# Patient Record
Sex: Female | Born: 1955
Health system: Southern US, Community
[De-identification: ages and names within clinical notes are randomized; demographics above are authoritative.]

## PROBLEM LIST (undated history)

## (undated) DIAGNOSIS — F5105 Insomnia due to other mental disorder: Secondary | ICD-10-CM

## (undated) DIAGNOSIS — Z0000 Encounter for general adult medical examination without abnormal findings: Secondary | ICD-10-CM

## (undated) DIAGNOSIS — B1920 Unspecified viral hepatitis C without hepatic coma: Secondary | ICD-10-CM

## (undated) DIAGNOSIS — E785 Hyperlipidemia, unspecified: Secondary | ICD-10-CM

## (undated) DIAGNOSIS — Z8619 Personal history of other infectious and parasitic diseases: Principal | ICD-10-CM

## (undated) DIAGNOSIS — E559 Vitamin D deficiency, unspecified: Secondary | ICD-10-CM

## (undated) HISTORY — DX: Unspecified viral hepatitis C without hepatic coma: B19.20

## (undated) HISTORY — DX: Encounter for general adult medical examination without abnormal findings: Z00.00

## (undated) HISTORY — PX: HAND SURGERY: SHX662

## (undated) HISTORY — DX: Vitamin D deficiency, unspecified: E55.9

## (undated) HISTORY — DX: Hyperlipidemia, unspecified: E78.5

## (undated) HISTORY — DX: Insomnia due to other mental disorder: F51.05

## (undated) HISTORY — DX: Personal history of other infectious and parasitic diseases: Z86.19

---

## 2011-09-22 LAB — HM MAMMOGRAPHY

## 2012-02-10 LAB — HM COLONOSCOPY

## 2014-07-18 ENCOUNTER — Other Ambulatory Visit: Payer: Self-pay

## 2014-07-18 DIAGNOSIS — Z1231 Encounter for screening mammogram for malignant neoplasm of breast: Secondary | ICD-10-CM

## 2014-07-19 ENCOUNTER — Ambulatory Visit
Admission: RE | Admit: 2014-07-19 | Discharge: 2014-07-19 | Disposition: A | Discharge status: Home / Self Care | Payer: 59 | Source: Ambulatory Visit

## 2014-07-19 DIAGNOSIS — Z1231 Encounter for screening mammogram for malignant neoplasm of breast: Secondary | ICD-10-CM

## 2014-11-20 ENCOUNTER — Other Ambulatory Visit: Payer: Self-pay | Admitting: Family Medicine

## 2014-11-20 DIAGNOSIS — R609 Edema, unspecified: Secondary | ICD-10-CM

## 2015-03-24 ENCOUNTER — Institutional Professional Consult (permissible substitution): Payer: Self-pay | Admitting: Family Medicine

## 2015-04-15 LAB — HM PAP SMEAR: HM PAP: NORMAL

## 2015-05-30 ENCOUNTER — Other Ambulatory Visit: Payer: Self-pay | Admitting: Family Medicine

## 2015-05-30 DIAGNOSIS — K7460 Unspecified cirrhosis of liver: Secondary | ICD-10-CM

## 2015-06-10 ENCOUNTER — Ambulatory Visit
Admission: RE | Admit: 2015-06-10 | Discharge: 2015-06-10 | Disposition: A | Discharge status: Home / Self Care | Payer: 59 | Source: Ambulatory Visit | Attending: Family Medicine | Admitting: Family Medicine

## 2015-06-10 DIAGNOSIS — K7460 Unspecified cirrhosis of liver: Secondary | ICD-10-CM

## 2015-07-08 MED FILL — ZOLPIDEM TARTRATE 10 MG TAB: 10 | 30 days supply | Qty: 30 | Fill #1

## 2015-07-14 DIAGNOSIS — M62838 Other muscle spasm: Secondary | ICD-10-CM | POA: Diagnosis not present

## 2015-07-17 DIAGNOSIS — M549 Dorsalgia, unspecified: Secondary | ICD-10-CM | POA: Diagnosis not present

## 2015-07-21 DIAGNOSIS — S39012D Strain of muscle, fascia and tendon of lower back, subsequent encounter: Secondary | ICD-10-CM | POA: Diagnosis not present

## 2015-08-07 ENCOUNTER — Telehealth: Payer: Self-pay | Admitting: *Deleted

## 2015-08-07 NOTE — Telephone Encounter (Signed)
Unable to reach patient at time of pre-visit call. Left message for patient to return call when available.  

## 2015-08-08 ENCOUNTER — Ambulatory Visit (INDEPENDENT_AMBULATORY_CARE_PROVIDER_SITE_OTHER): Payer: 59 | Admitting: Family Medicine

## 2015-08-08 ENCOUNTER — Encounter: Payer: Self-pay | Admitting: Family Medicine

## 2015-08-08 VITALS — BP 138/80 | HR 77 | Temp 98.4°F | Ht 65.0 in | Wt 139.5 lb

## 2015-08-08 DIAGNOSIS — Z8619 Personal history of other infectious and parasitic diseases: Secondary | ICD-10-CM | POA: Insufficient documentation

## 2015-08-08 DIAGNOSIS — Z0000 Encounter for general adult medical examination without abnormal findings: Secondary | ICD-10-CM

## 2015-08-08 DIAGNOSIS — B1920 Unspecified viral hepatitis C without hepatic coma: Secondary | ICD-10-CM

## 2015-08-08 HISTORY — DX: Personal history of other infectious and parasitic diseases: Z86.19

## 2015-08-08 NOTE — Progress Notes (Signed)
Pre visit review using our clinic review tool, if applicable. No additional management support is needed unless otherwise documented below in the visit note. 

## 2015-08-08 NOTE — Patient Instructions (Addendum)
NOW probiotics, 10 strain daily [email or web address]   Preventive Care for Adults, Female A healthy lifestyle and preventive care can promote health and wellness. Preventive health guidelines for women include the following key practices.  A routine yearly physical is a good way to check with your health care provider about your health and preventive screening. It is a chance to share any concerns and updates on your health and to receive a thorough exam.  Visit your dentist for a routine exam and preventive care every 6 months. Brush your teeth twice a day and floss once a day. Good oral hygiene prevents tooth decay and gum disease.  The frequency of eye exams is based on your age, health, family medical history, use of contact lenses, and other factors. Follow your health care provider's recommendations for frequency of eye exams.  Eat a healthy diet. Foods like vegetables, fruits, whole grains, low-fat dairy products, and lean protein foods contain the nutrients you need without too many calories. Decrease your intake of foods high in solid fats, added sugars, and salt. Eat the right amount of calories for you.Get information about a proper diet from your health care provider, if necessary.  Regular physical exercise is one of the most important things you can do for your health. Most adults should get at least 150 minutes of moderate-intensity exercise (any activity that increases your heart rate and causes you to sweat) each week. In addition, most adults need muscle-strengthening exercises on 2 or more days a week.  Maintain a healthy weight. The body mass index (BMI) is a screening tool to identify possible weight problems. It provides an estimate of body fat based on height and weight. Your health care provider can find your BMI and can help you achieve or maintain a healthy weight.For adults 20 years and older:  A BMI below 18.5 is considered underweight.  A BMI of 18.5 to 24.9 is  normal.  A BMI of 25 to 29.9 is considered overweight.  A BMI of 30 and above is considered obese.  Maintain normal blood lipids and cholesterol levels by exercising and minimizing your intake of saturated fat. Eat a balanced diet with plenty of fruit and vegetables. Blood tests for lipids and cholesterol should begin at age 98 and be repeated every 5 years. If your lipid or cholesterol levels are high, you are over 50, or you are at high risk for heart disease, you may need your cholesterol levels checked more frequently.Ongoing high lipid and cholesterol levels should be treated with medicines if diet and exercise are not working.  If you smoke, find out from your health care provider how to quit. If you do not use tobacco, do not start.  Lung cancer screening is recommended for adults aged 57-80 years who are at high risk for developing lung cancer because of a history of smoking. A yearly low-dose CT scan of the lungs is recommended for people who have at least a 30-pack-year history of smoking and are a current smoker or have quit within the past 15 years. A pack year of smoking is smoking an average of 1 pack of cigarettes a day for 1 year (for example: 1 pack a day for 30 years or 2 packs a day for 15 years). Yearly screening should continue until the smoker has stopped smoking for at least 15 years. Yearly screening should be stopped for people who develop a health problem that would prevent them from having lung cancer treatment.  If you are  pregnant, do not drink alcohol. If you are breastfeeding, be very cautious about drinking alcohol. If you are not pregnant and choose to drink alcohol, do not have more than 1 drink per day. One drink is considered to be 12 ounces (355 mL) of beer, 5 ounces (148 mL) of wine, or 1.5 ounces (44 mL) of liquor.  Avoid use of street drugs. Do not share needles with anyone. Ask for help if you need support or instructions about stopping the use of  drugs.  High blood pressure causes heart disease and increases the risk of stroke. Your blood pressure should be checked at least every 1 to 2 years. Ongoing high blood pressure should be treated with medicines if weight loss and exercise do not work.  If you are 88-84 years old, ask your health care provider if you should take aspirin to prevent strokes.  Diabetes screening is done by taking a blood sample to check your blood glucose level after you have not eaten for a certain period of time (fasting). If you are not overweight and you do not have risk factors for diabetes, you should be screened once every 3 years starting at age 34. If you are overweight or obese and you are 62-54 years of age, you should be screened for diabetes every year as part of your cardiovascular risk assessment.  Breast cancer screening is essential preventive care for women. You should practice "breast self-awareness." This means understanding the normal appearance and feel of your breasts and may include breast self-examination. Any changes detected, no matter how small, should be reported to a health care provider. Women in their 44s and 30s should have a clinical breast exam (CBE) by a health care provider as part of a regular health exam every 1 to 3 years. After age 63, women should have a CBE every year. Starting at age 73, women should consider having a mammogram (breast X-ray test) every year. Women who have a family history of breast cancer should talk to their health care provider about genetic screening. Women at a high risk of breast cancer should talk to their health care providers about having an MRI and a mammogram every year.  Breast cancer gene (BRCA)-related cancer risk assessment is recommended for women who have family members with BRCA-related cancers. BRCA-related cancers include breast, ovarian, tubal, and peritoneal cancers. Having family members with these cancers may be associated with an increased  risk for harmful changes (mutations) in the breast cancer genes BRCA1 and BRCA2. Results of the assessment will determine the need for genetic counseling and BRCA1 and BRCA2 testing.  Your health care provider may recommend that you be screened regularly for cancer of the pelvic organs (ovaries, uterus, and vagina). This screening involves a pelvic examination, including checking for microscopic changes to the surface of your cervix (Pap test). You may be encouraged to have this screening done every 3 years, beginning at age 74.  For women ages 70-65, health care providers may recommend pelvic exams and Pap testing every 3 years, or they may recommend the Pap and pelvic exam, combined with testing for human papilloma virus (HPV), every 5 years. Some types of HPV increase your risk of cervical cancer. Testing for HPV may also be done on women of any age with unclear Pap test results.  Other health care providers may not recommend any screening for nonpregnant women who are considered low risk for pelvic cancer and who do not have symptoms. Ask your health care provider  if a screening pelvic exam is right for you.  If you have had past treatment for cervical cancer or a condition that could lead to cancer, you need Pap tests and screening for cancer for at least 20 years after your treatment. If Pap tests have been discontinued, your risk factors (such as having a new sexual partner) need to be reassessed to determine if screening should resume. Some women have medical problems that increase the chance of getting cervical cancer. In these cases, your health care provider may recommend more frequent screening and Pap tests.  Colorectal cancer can be detected and often prevented. Most routine colorectal cancer screening begins at the age of 37 years and continues through age 14 years. However, your health care provider may recommend screening at an earlier age if you have risk factors for colon cancer. On a  yearly basis, your health care provider may provide home test kits to check for hidden blood in the stool. Use of a small camera at the end of a tube, to directly examine the colon (sigmoidoscopy or colonoscopy), can detect the earliest forms of colorectal cancer. Talk to your health care provider about this at age 23, when routine screening begins. Direct exam of the colon should be repeated every 5-10 years through age 66 years, unless early forms of precancerous polyps or small growths are found.  People who are at an increased risk for hepatitis B should be screened for this virus. You are considered at high risk for hepatitis B if:  You were born in a country where hepatitis B occurs often. Talk with your health care provider about which countries are considered high risk.  Your parents were born in a high-risk country and you have not received a shot to protect against hepatitis B (hepatitis B vaccine).  You have HIV or AIDS.  You use needles to inject street drugs.  You live with, or have sex with, someone who has hepatitis B.  You get hemodialysis treatment.  You take certain medicines for conditions like cancer, organ transplantation, and autoimmune conditions.  Hepatitis C blood testing is recommended for all people born from [birth year] through 1965 and any individual with known risks for hepatitis C.  Practice safe sex. Use condoms and avoid high-risk sexual practices to reduce the spread of sexually transmitted infections (STIs). STIs include gonorrhea, chlamydia, syphilis, trichomonas, herpes, HPV, and human immunodeficiency virus (HIV). Herpes, HIV, and HPV are viral illnesses that have no cure. They can result in disability, cancer, and death.  You should be screened for sexually transmitted illnesses (STIs) including gonorrhea and chlamydia if:  You are sexually active and are younger than 24 years.  You are older than 24 years and your health care provider tells you that you are  at risk for this type of infection.  Your sexual activity has changed since you were last screened and you are at an increased risk for chlamydia or gonorrhea. Ask your health care provider if you are at risk.  If you are at risk of being infected with HIV, it is recommended that you take a prescription medicine daily to prevent HIV infection. This is called preexposure prophylaxis (PrEP). You are considered at risk if:  You are sexually active and do not regularly use condoms or know the HIV status of your partner(s).  You take drugs by injection.  You are sexually active with a partner who has HIV.  Talk with your health care provider about whether you are at  high risk of being infected with HIV. If you choose to begin PrEP, you should first be tested for HIV. You should then be tested every 3 months for as long as you are taking PrEP.  Osteoporosis is a disease in which the bones lose minerals and strength with aging. This can result in serious bone fractures or breaks. The risk of osteoporosis can be identified using a bone density scan. Women ages 56 years and over and women at risk for fractures or osteoporosis should discuss screening with their health care providers. Ask your health care provider whether you should take a calcium supplement or vitamin D to reduce the rate of osteoporosis.  Menopause can be associated with physical symptoms and risks. Hormone replacement therapy is available to decrease symptoms and risks. You should talk to your health care provider about whether hormone replacement therapy is right for you.  Use sunscreen. Apply sunscreen liberally and repeatedly throughout the day. You should seek shade when your shadow is shorter than you. Protect yourself by wearing long sleeves, pants, a wide-brimmed hat, and sunglasses year round, whenever you are outdoors.  Once a month, do a whole body skin exam, using a mirror to look at the skin on your back. Tell your health  care provider of new moles, moles that have irregular borders, moles that are larger than a pencil eraser, or moles that have changed in shape or color.  Stay current with required vaccines (immunizations).  Influenza vaccine. All adults should be immunized every year.  Tetanus, diphtheria, and acellular pertussis (Td, Tdap) vaccine. Pregnant women should receive 1 dose of Tdap vaccine during each pregnancy. The dose should be obtained regardless of the length of time since the last dose. Immunization is preferred during the 27th-36th week of gestation. An adult who has not previously received Tdap or who does not know her vaccine status should receive 1 dose of Tdap. This initial dose should be followed by tetanus and diphtheria toxoids (Td) booster doses every 10 years. Adults with an unknown or incomplete history of completing a 3-dose immunization series with Td-containing vaccines should begin or complete a primary immunization series including a Tdap dose. Adults should receive a Td booster every 10 years.  Varicella vaccine. An adult without evidence of immunity to varicella should receive 2 doses or a second dose if she has previously received 1 dose. Pregnant females who do not have evidence of immunity should receive the first dose after pregnancy. This first dose should be obtained before leaving the health care facility. The second dose should be obtained 4-8 weeks after the first dose.  Human papillomavirus (HPV) vaccine. Females aged 13-26 years who have not received the vaccine previously should obtain the 3-dose series. The vaccine is not recommended for use in pregnant females. However, pregnancy testing is not needed before receiving a dose. If a female is found to be pregnant after receiving a dose, no treatment is needed. In that case, the remaining doses should be delayed until after the pregnancy. Immunization is recommended for any person with an immunocompromised condition through  the age of 42 years if she did not get any or all doses earlier. During the 3-dose series, the second dose should be obtained 4-8 weeks after the first dose. The third dose should be obtained 24 weeks after the first dose and 16 weeks after the second dose.  Zoster vaccine. One dose is recommended for adults aged 105 years or older unless certain conditions are present.  Measles, mumps, and rubella (MMR) vaccine. Adults born before [birth year] generally are considered immune to measles and mumps. Adults born in [birth year] or later should have 1 or more doses of MMR vaccine unless there is a contraindication to the vaccine or there is laboratory evidence of immunity to each of the three diseases. A routine second dose of MMR vaccine should be obtained at least 28 days after the first dose for students attending postsecondary schools, health care workers, or international travelers. People who received inactivated measles vaccine or an unknown type of measles vaccine during 1963-1967 should receive 2 doses of MMR vaccine. People who received inactivated mumps vaccine or an unknown type of mumps vaccine before 1979 and are at high risk for mumps infection should consider immunization with 2 doses of MMR vaccine. For females of childbearing age, rubella immunity should be determined. If there is no evidence of immunity, females who are not pregnant should be vaccinated. If there is no evidence of immunity, females who are pregnant should delay immunization until after pregnancy. Unvaccinated health care workers born before [birth year] who lack laboratory evidence of measles, mumps, or rubella immunity or laboratory confirmation of disease should consider measles and mumps immunization with 2 doses of MMR vaccine or rubella immunization with 1 dose of MMR vaccine.  Pneumococcal 13-valent conjugate (PCV13) vaccine. When indicated, a person who is uncertain of his immunization history and has no record of immunization should receive the  PCV13 vaccine. All adults 53 years of age and older should receive this vaccine. An adult aged 37 years or older who has certain medical conditions and has not been previously immunized should receive 1 dose of PCV13 vaccine. This PCV13 should be followed with a dose of pneumococcal polysaccharide (PPSV23) vaccine. Adults who are at high risk for pneumococcal disease should obtain the PPSV23 vaccine at least 8 weeks after the dose of PCV13 vaccine. Adults older than 61 years of age who have normal immune system function should obtain the PPSV23 vaccine dose at least 1 year after the dose of PCV13 vaccine.  Pneumococcal polysaccharide (PPSV23) vaccine. When PCV13 is also indicated, PCV13 should be obtained first. All adults aged 54 years and older should be immunized. An adult younger than age 65 years who has certain medical conditions should be immunized. Any person who resides in a nursing home or long-term care facility should be immunized. An adult smoker should be immunized. People with an immunocompromised condition and certain other conditions should receive both PCV13 and PPSV23 vaccines. People with human immunodeficiency virus (HIV) infection should be immunized as soon as possible after diagnosis. Immunization during chemotherapy or radiation therapy should be avoided. Routine use of PPSV23 vaccine is not recommended for American Indians, [place] Natives, or people younger than 65 years unless there are medical conditions that require PPSV23 vaccine. When indicated, people who have unknown immunization and have no record of immunization should receive PPSV23 vaccine. One-time revaccination 5 years after the first dose of PPSV23 is recommended for people aged 19-64 years who have chronic kidney failure, nephrotic syndrome, asplenia, or immunocompromised conditions. People who received 1-2 doses of PPSV23 before age 40 years should receive another dose of PPSV23 vaccine at age 64 years or later if at least  5 years have passed since the previous dose. Doses of PPSV23 are not needed for people immunized with PPSV23 at or after age 102 years.  Meningococcal vaccine. Adults with asplenia or persistent complement component deficiencies should receive 2 doses of quadrivalent meningococcal  conjugate (MenACWY-D) vaccine. The doses should be obtained at least 2 months apart. Microbiologists working with certain meningococcal bacteria, [employer] recruits, people at risk during an outbreak, and people who travel to or live in countries with a high rate of meningitis should be immunized. A first-year college student up through age 65 years who is living in a residence hall should receive a dose if she did not receive a dose on or after her 16th birthday. Adults who have certain high-risk conditions should receive one or more doses of vaccine.  Hepatitis A vaccine. Adults who wish to be protected from this disease, have certain high-risk conditions, work with hepatitis A-infected animals, work in hepatitis A research labs, or travel to or work in countries with a high rate of hepatitis A should be immunized. Adults who were previously unvaccinated and who anticipate close contact with an international adoptee during the first 60 days after arrival in the [place] from a country with a high rate of hepatitis A should be immunized.  Hepatitis B vaccine. Adults who wish to be protected from this disease, have certain high-risk conditions, may be exposed to blood or other infectious body fluids, are household contacts or sex partners of hepatitis B positive people, are clients or workers in certain care facilities, or travel to or work in countries with a high rate of hepatitis B should be immunized.  Haemophilus influenzae type b (Hib) vaccine. A previously unvaccinated person with asplenia or sickle cell disease or having a scheduled splenectomy should receive 1 dose of Hib vaccine. Regardless of previous immunization, a  recipient of a hematopoietic stem cell transplant should receive a 3-dose series 6-12 months after her successful transplant. Hib vaccine is not recommended for adults with HIV infection. Preventive Services / Frequency Ages 85 to 67 years  Blood pressure check.** / Every 3-5 years.  Lipid and cholesterol check.** / Every 5 years beginning at age 77.  Clinical breast exam.** / Every 3 years for women in their 38s and 44s.  BRCA-related cancer risk assessment.** / For women who have family members with a BRCA-related cancer (breast, ovarian, tubal, or peritoneal cancers).  Pap test.** / Every 2 years from ages 60 through 5. Every 3 years starting at age 37 through age 53 or 71 with a history of 3 consecutive normal Pap tests.  HPV screening.** / Every 3 years from ages 66 through ages 77 to 38 with a history of 3 consecutive normal Pap tests.  Hepatitis C blood test.** / For any individual with known risks for hepatitis C.  Skin self-exam. / Monthly.  Influenza vaccine. / Every year.  Tetanus, diphtheria, and acellular pertussis (Tdap, Td) vaccine.** / Consult your health care provider. Pregnant women should receive 1 dose of Tdap vaccine during each pregnancy. 1 dose of Td every 10 years.  Varicella vaccine.** / Consult your health care provider. Pregnant females who do not have evidence of immunity should receive the first dose after pregnancy.  HPV vaccine. / 3 doses over 6 months, if 75 and younger. The vaccine is not recommended for use in pregnant females. However, pregnancy testing is not needed before receiving a dose.  Measles, mumps, rubella (MMR) vaccine.** / You need at least 1 dose of MMR if you were born in 1957 or later. You may also need a 2nd dose. For females of childbearing age, rubella immunity should be determined. If there is no evidence of immunity, females who are not pregnant should be vaccinated. If there  is no evidence of immunity, females who are pregnant  should delay immunization until after pregnancy.  Pneumococcal 13-valent conjugate (PCV13) vaccine.** / Consult your health care provider.  Pneumococcal polysaccharide (PPSV23) vaccine.** / 1 to 2 doses if you smoke cigarettes or if you have certain conditions.  Meningococcal vaccine.** / 1 dose if you are age 42 to 34 years and a Market researcher living in a residence hall, or have one of several medical conditions, you need to get vaccinated against meningococcal disease. You may also need additional booster doses.  Hepatitis A vaccine.** / Consult your health care provider.  Hepatitis B vaccine.** / Consult your health care provider.  Haemophilus influenzae type b (Hib) vaccine.** / Consult your health care provider. Ages 59 to 89 years  Blood pressure check.** / Every year.  Lipid and cholesterol check.** / Every 5 years beginning at age 45 years.  Lung cancer screening. / Every year if you are aged 4-80 years and have a 30-pack-year history of smoking and currently smoke or have quit within the past 15 years. Yearly screening is stopped once you have quit smoking for at least 15 years or develop a health problem that would prevent you from having lung cancer treatment.  Clinical breast exam.** / Every year after age 25 years.  BRCA-related cancer risk assessment.** / For women who have family members with a BRCA-related cancer (breast, ovarian, tubal, or peritoneal cancers).  Mammogram.** / Every year beginning at age 58 years and continuing for as long as you are in good health. Consult with your health care provider.  Pap test.** / Every 3 years starting at age 51 years through age 33 or 56 years with a history of 3 consecutive normal Pap tests.  HPV screening.** / Every 3 years from ages 36 years through ages 34 to 70 years with a history of 3 consecutive normal Pap tests.  Fecal occult blood test (FOBT) of stool. / Every year beginning at age 64 years and  continuing until age 57 years. You may not need to do this test if you get a colonoscopy every 10 years.  Flexible sigmoidoscopy or colonoscopy.** / Every 5 years for a flexible sigmoidoscopy or every 10 years for a colonoscopy beginning at age 21 years and continuing until age 31 years.  Hepatitis C blood test.** / For all people born from [birth year] through 1965 and any individual with known risks for hepatitis C.  Skin self-exam. / Monthly.  Influenza vaccine. / Every year.  Tetanus, diphtheria, and acellular pertussis (Tdap/Td) vaccine.** / Consult your health care provider. Pregnant women should receive 1 dose of Tdap vaccine during each pregnancy. 1 dose of Td every 10 years.  Varicella vaccine.** / Consult your health care provider. Pregnant females who do not have evidence of immunity should receive the first dose after pregnancy.  Zoster vaccine.** / 1 dose for adults aged 40 years or older.  Measles, mumps, rubella (MMR) vaccine.** / You need at least 1 dose of MMR if you were born in 1957 or later. You may also need a second dose. For females of childbearing age, rubella immunity should be determined. If there is no evidence of immunity, females who are not pregnant should be vaccinated. If there is no evidence of immunity, females who are pregnant should delay immunization until after pregnancy.  Pneumococcal 13-valent conjugate (PCV13) vaccine.** / Consult your health care provider.  Pneumococcal polysaccharide (PPSV23) vaccine.** / 1 to 2 doses if you smoke cigarettes or if  you have certain conditions.  Meningococcal vaccine.** / Consult your health care provider.  Hepatitis A vaccine.** / Consult your health care provider.  Hepatitis B vaccine.** / Consult your health care provider.  Haemophilus influenzae type b (Hib) vaccine.** / Consult your health care provider. Ages 8 years and over  Blood pressure check.** / Every year.  Lipid and cholesterol check.** / Every 5 years  beginning at age 46 years.  Lung cancer screening. / Every year if you are aged 68-80 years and have a 30-pack-year history of smoking and currently smoke or have quit within the past 15 years. Yearly screening is stopped once you have quit smoking for at least 15 years or develop a health problem that would prevent you from having lung cancer treatment.  Clinical breast exam.** / Every year after age 67 years.  BRCA-related cancer risk assessment.** / For women who have family members with a BRCA-related cancer (breast, ovarian, tubal, or peritoneal cancers).  Mammogram.** / Every year beginning at age 53 years and continuing for as long as you are in good health. Consult with your health care provider.  Pap test.** / Every 3 years starting at age 78 years through age 38 or 30 years with 3 consecutive normal Pap tests. Testing can be stopped between 65 and 70 years with 3 consecutive normal Pap tests and no abnormal Pap or HPV tests in the past 10 years.  HPV screening.** / Every 3 years from ages 68 years through ages 35 or 60 years with a history of 3 consecutive normal Pap tests. Testing can be stopped between 65 and 70 years with 3 consecutive normal Pap tests and no abnormal Pap or HPV tests in the past 10 years.  Fecal occult blood test (FOBT) of stool. / Every year beginning at age 73 years and continuing until age 34 years. You may not need to do this test if you get a colonoscopy every 10 years.  Flexible sigmoidoscopy or colonoscopy.** / Every 5 years for a flexible sigmoidoscopy or every 10 years for a colonoscopy beginning at age 78 years and continuing until age 51 years.  Hepatitis C blood test.** / For all people born from [birth year] through 1965 and any individual with known risks for hepatitis C.  Osteoporosis screening.** / A one-time screening for women ages 61 years and over and women at risk for fractures or osteoporosis.  Skin self-exam. / Monthly.  Influenza vaccine. /  Every year.  Tetanus, diphtheria, and acellular pertussis (Tdap/Td) vaccine.** / 1 dose of Td every 10 years.  Varicella vaccine.** / Consult your health care provider.  Zoster vaccine.** / 1 dose for adults aged 4 years or older.  Pneumococcal 13-valent conjugate (PCV13) vaccine.** / Consult your health care provider.  Pneumococcal polysaccharide (PPSV23) vaccine.** / 1 dose for all adults aged 32 years and older.  Meningococcal vaccine.** / Consult your health care provider.  Hepatitis A vaccine.** / Consult your health care provider.  Hepatitis B vaccine.** / Consult your health care provider.  Haemophilus influenzae type b (Hib) vaccine.** / Consult your health care provider. ** Family history and personal history of risk and conditions may change your health care provider's recommendations.   This information is not intended to replace advice given to you by your health care provider. Make sure you discuss any questions you have with your health care provider.   Document Released: 07/27/2001 Document Revised: 06/21/2014 Document Reviewed: 10/26/2010 Elsevier Interactive Patient Education [employer].

## 2015-08-12 MED FILL — ESTRADIOL 0.075 MG PATCH: 0.075 | 84 days supply | Qty: 24 | Fill #1

## 2015-08-12 MED FILL — MEDROXYPROGESTERONE 2.5 MG: 2.5 | 90 days supply | Qty: 90 | Fill #0

## 2015-08-17 ENCOUNTER — Encounter: Payer: Self-pay | Admitting: Family Medicine

## 2015-08-17 DIAGNOSIS — B1920 Unspecified viral hepatitis C without hepatic coma: Secondary | ICD-10-CM

## 2015-08-17 DIAGNOSIS — Z0000 Encounter for general adult medical examination without abnormal findings: Secondary | ICD-10-CM | POA: Insufficient documentation

## 2015-08-17 HISTORY — DX: Unspecified viral hepatitis C without hepatic coma: B19.20

## 2015-08-17 HISTORY — DX: Encounter for general adult medical examination without abnormal findings: Z00.00

## 2015-08-17 NOTE — Assessment & Plan Note (Signed)
Treated in [place] and at [facility] in 2005 and needed retreatment for relapse ultimately reaching remission in 2007 after 6 mn course of Interferon and Ribavirin

## 2015-08-17 NOTE — Assessment & Plan Note (Signed)
Patient encouraged to maintain heart healthy diet, regular exercise, adequate sleep. Consider daily probiotics. Take medications as prescribed 
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
## 2015-09-03 MED FILL — ZOLPIDEM TARTRATE 10 MG TAB: 10 | 30 days supply | Qty: 30 | Fill #2

## 2015-10-08 ENCOUNTER — Encounter: Payer: Self-pay | Admitting: Family Medicine

## 2015-10-08 NOTE — Telephone Encounter (Signed)
Please advise 

## 2015-10-09 ENCOUNTER — Other Ambulatory Visit: Payer: Self-pay | Admitting: Family Medicine

## 2015-10-09 ENCOUNTER — Telehealth: Payer: Self-pay | Admitting: Family Medicine

## 2015-10-09 MED ORDER — ALPRAZOLAM 0.25 MG PO TABS: ORAL_TABLET | ORAL | Status: DC

## 2015-10-09 MED FILL — ALPRAZolam 0.25 MG TABS: 0.25 | 5 days supply | Qty: 10 | Fill #0

## 2015-10-09 NOTE — Telephone Encounter (Signed)
-----   Message from [name], MD sent at 10/08/2015 10:59 PM EDT ----- She is traveling to [place] in a couple of weeks. She can have Alprazolam 0.25 mg tabs 1 tab po bid prn anxiety, disp #10. She also asked the probiotic I recommend, [employer] company has a 10 strain probiotic cap, 1 cap daily can purchase at [facility] of online at [email or web address]. Also let her know calcium should be roughly 1200 to 1500 mg daily. Each serving in diet is about 500 so if she is not getting enough in diet then she should add Citracal.

## 2015-10-09 NOTE — Telephone Encounter (Signed)
Patient informed script sent in to [place] and informed of all PCP instructions.

## 2015-10-16 DIAGNOSIS — G5761 Lesion of plantar nerve, right lower limb: Secondary | ICD-10-CM | POA: Diagnosis not present

## 2015-10-16 DIAGNOSIS — M7751 Other enthesopathy of right foot: Secondary | ICD-10-CM | POA: Diagnosis not present

## 2015-10-16 MED FILL — ZOLPIDEM TARTRATE 10 MG TAB: 10 | 30 days supply | Qty: 30 | Fill #3

## 2015-10-28 MED FILL — ESTRADIOL 0.075 MG PATCH: 0.075 | 84 days supply | Qty: 24 | Fill #2

## 2015-10-29 DIAGNOSIS — M7751 Other enthesopathy of right foot: Secondary | ICD-10-CM | POA: Diagnosis not present

## 2015-10-29 DIAGNOSIS — G5761 Lesion of plantar nerve, right lower limb: Secondary | ICD-10-CM | POA: Diagnosis not present

## 2015-10-29 MED FILL — MEDROXYPROGESTERONE 2.5 MG: 2.5 | 90 days supply | Qty: 90 | Fill #1

## 2015-12-26 MED FILL — ZOLPIDEM TARTRATE 10 MG TAB: 10 | 30 days supply | Qty: 30 | Fill #0

## 2016-01-19 MED FILL — ESTRADIOL 0.075 MG PATCH: 0.075 | 84 days supply | Qty: 24 | Fill #0

## 2016-01-19 MED FILL — MEDROXYPROGESTERONE 2.5 MG: 2.5 | 90 days supply | Qty: 90 | Fill #2

## 2016-03-02 DIAGNOSIS — M7731 Calcaneal spur, right foot: Secondary | ICD-10-CM | POA: Diagnosis not present

## 2016-03-02 DIAGNOSIS — M7732 Calcaneal spur, left foot: Secondary | ICD-10-CM | POA: Diagnosis not present

## 2016-03-02 DIAGNOSIS — G5761 Lesion of plantar nerve, right lower limb: Secondary | ICD-10-CM | POA: Diagnosis not present

## 2016-03-02 DIAGNOSIS — M71571 Other bursitis, not elsewhere classified, right ankle and foot: Secondary | ICD-10-CM | POA: Diagnosis not present

## 2016-03-02 DIAGNOSIS — M71572 Other bursitis, not elsewhere classified, left ankle and foot: Secondary | ICD-10-CM | POA: Diagnosis not present

## 2016-03-02 DIAGNOSIS — M722 Plantar fascial fibromatosis: Secondary | ICD-10-CM | POA: Diagnosis not present

## 2016-03-03 DIAGNOSIS — M71572 Other bursitis, not elsewhere classified, left ankle and foot: Secondary | ICD-10-CM | POA: Diagnosis not present

## 2016-03-03 DIAGNOSIS — M7731 Calcaneal spur, right foot: Secondary | ICD-10-CM | POA: Diagnosis not present

## 2016-03-03 DIAGNOSIS — G5761 Lesion of plantar nerve, right lower limb: Secondary | ICD-10-CM | POA: Diagnosis not present

## 2016-03-03 DIAGNOSIS — M722 Plantar fascial fibromatosis: Secondary | ICD-10-CM | POA: Diagnosis not present

## 2016-03-03 DIAGNOSIS — M7732 Calcaneal spur, left foot: Secondary | ICD-10-CM | POA: Diagnosis not present

## 2016-03-03 DIAGNOSIS — M71571 Other bursitis, not elsewhere classified, right ankle and foot: Secondary | ICD-10-CM | POA: Diagnosis not present

## 2016-03-05 MED FILL — ZOLPIDEM TARTRATE 10 MG TAB: 10 | 30 days supply | Qty: 30 | Fill #1

## 2016-03-13 ENCOUNTER — Telehealth: Payer: 59 | Admitting: Family

## 2016-03-13 DIAGNOSIS — M546 Pain in thoracic spine: Secondary | ICD-10-CM | POA: Diagnosis not present

## 2016-03-13 MED ORDER — CYCLOBENZAPRINE HCL 10 MG PO TABS: 10.0000 mg | ORAL_TABLET | Freq: Three times a day (TID) | ORAL | 0 refills | Status: DC | PRN

## 2016-03-13 MED ORDER — NAPROXEN 500 MG PO TABS: 500.0000 mg | ORAL_TABLET | Freq: Two times a day (BID) | ORAL | 0 refills | Status: DC

## 2016-03-13 NOTE — Progress Notes (Signed)

## 2016-04-07 MED FILL — ESTRADIOL 0.075 MG PATCH: 0.075 | 84 days supply | Qty: 24 | Fill #1

## 2016-04-16 ENCOUNTER — Other Ambulatory Visit: Payer: Self-pay | Admitting: Obstetrics and Gynecology

## 2016-04-16 DIAGNOSIS — Z1231 Encounter for screening mammogram for malignant neoplasm of breast: Secondary | ICD-10-CM

## 2016-04-21 MED FILL — ZOLPIDEM TARTRATE 10 MG TAB: 10 | 30 days supply | Qty: 30 | Fill #2

## 2016-05-03 ENCOUNTER — Telehealth: Payer: Self-pay | Admitting: Family

## 2016-05-03 ENCOUNTER — Ambulatory Visit (INDEPENDENT_AMBULATORY_CARE_PROVIDER_SITE_OTHER): Payer: 59 | Admitting: Family

## 2016-05-03 ENCOUNTER — Encounter: Payer: Self-pay | Admitting: Family

## 2016-05-03 VITALS — BP 151/72 | HR 74 | Temp 98.3°F | Resp 18 | Ht 65.0 in | Wt 142.6 lb

## 2016-05-03 DIAGNOSIS — M25552 Pain in left hip: Secondary | ICD-10-CM

## 2016-05-03 MED ORDER — MELOXICAM 7.5 MG PO TABS: 7.5000 mg | ORAL_TABLET | Freq: Every day | ORAL | 0 refills | Status: DC

## 2016-05-03 MED FILL — MELOXICAM 7.5 MG TABLET: 7.5 | 30 days supply | Qty: 30 | Fill #0

## 2016-05-03 NOTE — Telephone Encounter (Signed)
Notified pt and she voices understanding. 

## 2016-05-03 NOTE — Progress Notes (Signed)
Pre visit review using our clinic review tool, if applicable. No additional management support is needed unless otherwise documented below in the visit note. 

## 2016-05-03 NOTE — Patient Instructions (Signed)
Please begin meloxicam.  You will be contacted about your referral to sports medicine.
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
## 2016-05-03 NOTE — Telephone Encounter (Signed)
Please contact patient and let her know that her BP was elevated during her visit. Could she please recheck her bp at home/work and send [place] her readings via mychart?

## 2016-05-04 MED FILL — MEDROXYPROGESTERONE 2.5 MG: 2.5 | 90 days supply | Qty: 90 | Fill #3

## 2016-05-18 ENCOUNTER — Ambulatory Visit (INDEPENDENT_AMBULATORY_CARE_PROVIDER_SITE_OTHER): Payer: Self-pay

## 2016-05-18 ENCOUNTER — Ambulatory Visit (INDEPENDENT_AMBULATORY_CARE_PROVIDER_SITE_OTHER): Payer: 59 | Admitting: Orthopaedic Surgery

## 2016-05-18 DIAGNOSIS — M25552 Pain in left hip: Secondary | ICD-10-CM | POA: Diagnosis not present

## 2016-05-18 DIAGNOSIS — M7062 Trochanteric bursitis, left hip: Secondary | ICD-10-CM

## 2016-05-18 MED ORDER — METHYLPREDNISOLONE ACETATE 40 MG/ML IJ SUSP
40.0000 mg | INTRAMUSCULAR | Status: AC | PRN
  Administered 2016-05-18: 40 mg via INTRA_ARTICULAR

## 2016-05-18 MED ORDER — LIDOCAINE HCL 1 % IJ SOLN
3.0000 mL | INTRAMUSCULAR | Status: AC | PRN
  Administered 2016-05-18: 3 mL

## 2016-05-18 MED ORDER — DICLOFENAC SODIUM 1 % TD GEL: 2.0000 g | Freq: Four times a day (QID) | TRANSDERMAL | 3 refills | Status: DC

## 2016-05-18 MED FILL — DICLOFENAC SODIUM 1% GEL: 1 | 10 days supply | Qty: 100 | Fill #0
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
## 2016-05-20 ENCOUNTER — Ambulatory Visit
Admission: RE | Admit: 2016-05-20 | Discharge: 2016-05-20 | Disposition: A | Discharge status: Home / Self Care | Payer: 59 | Source: Ambulatory Visit | Attending: Obstetrics and Gynecology | Admitting: Obstetrics and Gynecology

## 2016-05-20 DIAGNOSIS — Z1231 Encounter for screening mammogram for malignant neoplasm of breast: Secondary | ICD-10-CM | POA: Diagnosis not present

## 2016-05-28 ENCOUNTER — Ambulatory Visit (INDEPENDENT_AMBULATORY_CARE_PROVIDER_SITE_OTHER): Payer: 59 | Admitting: Rehabilitative and Restorative Service Providers"

## 2016-05-28 ENCOUNTER — Encounter: Payer: Self-pay | Admitting: Rehabilitative and Restorative Service Providers"

## 2016-05-28 ENCOUNTER — Ambulatory Visit: Payer: 59

## 2016-05-28 DIAGNOSIS — M25552 Pain in left hip: Secondary | ICD-10-CM

## 2016-05-28 DIAGNOSIS — R29898 Other symptoms and signs involving the musculoskeletal system: Secondary | ICD-10-CM | POA: Diagnosis not present
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
## 2016-05-28 NOTE — Patient Instructions (Signed)
glute set - lying on stomach  Tighten buttocks  100-75-50-25-50-75-100% Holding 5 sec each  Repeat 5-10 times   HIP: Hamstrings - Supine   Place strap around foot. Raise leg up, keeping knee straight.  Bend opposite knee to protect back if indicated. Hold 30 seconds. 3 reps per set, 2-3 sets per day     Outer Hip Stretch: Reclined IT Band Stretch (Strap)   Strap around one foot, pull leg across body until you feel a pull or stretch, with shoulders on mat. Hold for 30 seconds. Repeat 3 times each leg. 2-3 times/day.  Piriformis Stretch   Lying on back, pull right knee toward opposite shoulder. Hold 30 seconds. Repeat 3 times. Do 2-3 sessions per day.  TENS UNIT: This is helpful for muscle pain and spasm.   Search and Purchase a TENS 7000 2nd edition at www.tenspros.com. It should be less than $30.     TENS unit instructions: Do not shower or bathe with the unit on Turn the unit off before removing electrodes or batteries If the electrodes lose stickiness add a drop of water to the electrodes after they are disconnected from the unit and place on plastic sheet. If you continued to have difficulty, call the TENS unit company to purchase more electrodes. Do not apply lotion on the skin area prior to use. Make sure the skin is clean and dry as this will help prolong the life of the electrodes. After use, always check skin for unusual red areas, rash or other skin difficulties. If there are any skin problems, does not apply electrodes to the same area. Never remove the electrodes from the unit by pulling the wires. Do not use the TENS unit or electrodes other than as directed. Do not change electrode placement without consultating your therapist or physician. Keep 2 fingers with between each electrode.

## 2016-05-31 ENCOUNTER — Encounter: Payer: 59 | Admitting: Rehabilitative and Restorative Service Providers"

## 2016-05-31 ENCOUNTER — Encounter: Payer: 59 | Admitting: Physical Therapy

## 2016-06-01 ENCOUNTER — Other Ambulatory Visit (INDEPENDENT_AMBULATORY_CARE_PROVIDER_SITE_OTHER): Payer: 59

## 2016-06-01 DIAGNOSIS — Z8619 Personal history of other infectious and parasitic diseases: Secondary | ICD-10-CM

## 2016-06-01 DIAGNOSIS — Z0000 Encounter for general adult medical examination without abnormal findings: Secondary | ICD-10-CM | POA: Diagnosis not present

## 2016-06-01 LAB — LIPID PANEL
Cholesterol: 198 mg/dL (ref 0–200)
HDL: 53.9 mg/dL (ref >39.00)
LDL Cholesterol: 127 mg/dL — ABNORMAL HIGH (ref 0–99)
NonHDL: 144.33
TRIGLYCERIDES: 89 mg/dL (ref 0.0–149.0)
Total CHOL/HDL Ratio: 4
VLDL: 17.8 mg/dL (ref 0.0–40.0)

## 2016-06-01 LAB — CBC
HCT: 35.7 % — ABNORMAL LOW (ref 36.0–46.0)
Hemoglobin: 12.2 g/dL (ref 12.0–15.0)
MCHC: 34.3 g/dL (ref 30.0–36.0)
MCV: 96.4 fl (ref 78.0–100.0)
Platelets: 118 10*3/uL — ABNORMAL LOW (ref 150.0–400.0)
RBC: 3.71 Mil/uL — AB (ref 3.87–5.11)
RDW: 13.8 % (ref 11.5–15.5)
WBC: 3.4 10*3/uL — AB (ref 4.0–10.5)

## 2016-06-01 LAB — COMPREHENSIVE METABOLIC PANEL
ALBUMIN: 3.9 g/dL (ref 3.5–5.2)
ALK PHOS: 20 U/L — AB (ref 39–117)
ALT: 13 U/L (ref 0–35)
AST: 17 U/L (ref 0–37)
BILIRUBIN TOTAL: 0.8 mg/dL (ref 0.2–1.2)
BUN: 9 mg/dL (ref 6–23)
CALCIUM: 8.8 mg/dL (ref 8.4–10.5)
CO2: 29 meq/L (ref 19–32)
CREATININE: 0.78 mg/dL (ref 0.40–1.20)
Chloride: 105 mEq/L (ref 96–112)
GFR: 79.6 mL/min (ref >60.00)
Glucose, Bld: 84 mg/dL (ref 70–99)
Potassium: 3.7 mEq/L (ref 3.5–5.1)
Sodium: 140 mEq/L (ref 135–145)
TOTAL PROTEIN: 6 g/dL (ref 6.0–8.3)

## 2016-06-01 LAB — TSH: TSH: 1.67 u[IU]/mL (ref 0.35–4.50)

## 2016-06-02 ENCOUNTER — Encounter: Payer: 59 | Admitting: Rehabilitative and Restorative Service Providers"

## 2016-06-03 ENCOUNTER — Ambulatory Visit (INDEPENDENT_AMBULATORY_CARE_PROVIDER_SITE_OTHER): Payer: 59 | Admitting: Physical Therapy

## 2016-06-03 DIAGNOSIS — M25552 Pain in left hip: Secondary | ICD-10-CM

## 2016-06-03 DIAGNOSIS — R29898 Other symptoms and signs involving the musculoskeletal system: Secondary | ICD-10-CM

## 2016-06-03 NOTE — Patient Instructions (Signed)
HIP: Hamstrings - Supine   Place strap around foot. Raise leg up, keeping knee straight.  Bend opposite knee to protect back if indicated. Hold 30 seconds. 3 reps per set, 2-3 sets per day     Outer Hip Stretch: Reclined IT Band Stretch (Strap)   Strap around one foot, pull leg across body until you feel a pull or stretch, with shoulders on mat. Hold for 30 seconds. Repeat 3 times each leg. 2-3 times/day.  Piriformis Stretch   Lying on back, pull right knee toward opposite shoulder. Hold 30 seconds. Repeat 3 times. Do 2-3 sessions per day.
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
## 2016-06-09 ENCOUNTER — Ambulatory Visit (INDEPENDENT_AMBULATORY_CARE_PROVIDER_SITE_OTHER): Payer: 59 | Admitting: Rehabilitative and Restorative Service Providers"

## 2016-06-09 DIAGNOSIS — M25552 Pain in left hip: Secondary | ICD-10-CM | POA: Diagnosis not present

## 2016-06-09 DIAGNOSIS — R29898 Other symptoms and signs involving the musculoskeletal system: Secondary | ICD-10-CM

## 2016-06-09 MED FILL — ZOLPIDEM TARTRATE 10 MG TAB: 10 | 30 days supply | Qty: 30 | Fill #3
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
## 2016-06-09 NOTE — Patient Instructions (Addendum)
Abduction: [name] (Eccentric) - Side-Lying  - Lie on side with knees bent. Lift top knee, keeping feet together. Keep trunk steady. Lift to a 2-3 count Slowly lower for 3-5 seconds. _10__ reps per set, 1__ sets per day  Strengthening: Hip Abduction - Resisted    With tubing around right leg, other side toward anchor, extend leg out from side. Repeat __10__ times per set. Do _1-2___ sets per session. Do ___1_ sessions per day.   Strengthening: Hip Extension - Resisted    With tubing around right ankle, face anchor and pull leg straight back. Repeat __10__ times per set. Do _1-2___ sets per session. Do _1___ sessions per day.  Trigger Point Dry Needling  . What is Trigger Point Dry Needling (DN)? o DN is a physical therapy technique used to treat muscle pain and dysfunction. Specifically, DN helps deactivate muscle trigger points (muscle knots).  o A thin filiform needle is used to penetrate the skin and stimulate the underlying trigger point. The goal is for a local twitch response (LTR) to occur and for the trigger point to relax. No medication of any kind is injected during the procedure.   . What Does Trigger Point Dry Needling Feel Like?  o The procedure feels different for each individual patient. Some patients report that they do not actually feel the needle enter the skin and overall the process is not painful. Very mild bleeding may occur. However, many patients feel a deep cramping in the muscle in which the needle was inserted. This is the local twitch response.   Marland Kitchen How Will I feel after the treatment? o Soreness is normal, and the onset of soreness may not occur for a few hours. Typically this soreness does not last longer than two days.  o Bruising is uncommon, however; ice can be used to decrease any possible bruising.  o In rare cases feeling tired or nauseous after the treatment is normal. In addition, your symptoms may get worse before they get better, this period will  typically not last longer than 24 hours.   . What Can I do After My Treatment? o Increase your hydration by drinking more water for the next 24 hours. o You may place ice or heat on the areas treated that have become sore, however, do not use heat on inflamed or bruised areas. Heat often brings more relief post needling. o You can continue your regular activities, but vigorous activity is not recommended initially after the treatment for 24 hours. o DN is best combined with other physical therapy such as strengthening, stretching, and other therapies.

## 2016-06-15 DIAGNOSIS — Z79890 Hormone replacement therapy (postmenopausal): Secondary | ICD-10-CM | POA: Diagnosis not present

## 2016-06-15 DIAGNOSIS — F5101 Primary insomnia: Secondary | ICD-10-CM | POA: Diagnosis not present

## 2016-06-15 DIAGNOSIS — Z01419 Encounter for gynecological examination (general) (routine) without abnormal findings: Secondary | ICD-10-CM | POA: Diagnosis not present

## 2016-06-15 DIAGNOSIS — N951 Menopausal and female climacteric states: Secondary | ICD-10-CM | POA: Diagnosis not present

## 2016-06-15 DIAGNOSIS — Z124 Encounter for screening for malignant neoplasm of cervix: Secondary | ICD-10-CM | POA: Diagnosis not present

## 2016-06-15 LAB — HM PAP SMEAR

## 2016-06-15 LAB — RESULTS CONSOLE HPV: CHL HPV: NEGATIVE

## 2016-06-16 ENCOUNTER — Ambulatory Visit (INDEPENDENT_AMBULATORY_CARE_PROVIDER_SITE_OTHER): Payer: 59 | Admitting: Rehabilitative and Restorative Service Providers"

## 2016-06-16 ENCOUNTER — Encounter: Payer: Self-pay | Admitting: Rehabilitative and Restorative Service Providers"

## 2016-06-16 DIAGNOSIS — R29898 Other symptoms and signs involving the musculoskeletal system: Secondary | ICD-10-CM

## 2016-06-16 DIAGNOSIS — M25552 Pain in left hip: Secondary | ICD-10-CM
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
## 2016-06-16 NOTE — Patient Instructions (Signed)

## 2016-06-17 ENCOUNTER — Encounter: Payer: Self-pay | Admitting: Family Medicine

## 2016-06-17 ENCOUNTER — Ambulatory Visit (INDEPENDENT_AMBULATORY_CARE_PROVIDER_SITE_OTHER): Payer: 59 | Admitting: Family Medicine

## 2016-06-17 VITALS — BP 118/66 | HR 75 | Temp 97.8°F | Ht 64.0 in | Wt 143.8 lb

## 2016-06-17 DIAGNOSIS — G5760 Lesion of plantar nerve, unspecified lower limb: Secondary | ICD-10-CM

## 2016-06-17 DIAGNOSIS — D649 Anemia, unspecified: Secondary | ICD-10-CM | POA: Diagnosis not present

## 2016-06-17 DIAGNOSIS — M25579 Pain in unspecified ankle and joints of unspecified foot: Secondary | ICD-10-CM

## 2016-06-17 DIAGNOSIS — Z0000 Encounter for general adult medical examination without abnormal findings: Secondary | ICD-10-CM | POA: Diagnosis not present

## 2016-06-17 DIAGNOSIS — M7070 Other bursitis of hip, unspecified hip: Secondary | ICD-10-CM | POA: Insufficient documentation

## 2016-06-17 DIAGNOSIS — M7072 Other bursitis of hip, left hip: Secondary | ICD-10-CM

## 2016-06-17 DIAGNOSIS — E559 Vitamin D deficiency, unspecified: Secondary | ICD-10-CM

## 2016-06-17 DIAGNOSIS — E785 Hyperlipidemia, unspecified: Secondary | ICD-10-CM

## 2016-06-17 HISTORY — DX: Vitamin D deficiency, unspecified: E55.9

## 2016-06-17 HISTORY — DX: Hyperlipidemia, unspecified: E78.5

## 2016-06-17 MED ORDER — CARISOPRODOL 350 MG PO TABS: 350.0000 mg | ORAL_TABLET | Freq: Two times a day (BID) | ORAL | 1 refills | Status: DC | PRN

## 2016-06-17 MED ORDER — MELOXICAM 15 MG PO TABS: 15.0000 mg | ORAL_TABLET | Freq: Every day | ORAL | 3 refills | Status: DC | PRN

## 2016-06-17 MED FILL — MELOXICAM 15 MG TABLET: 15 | 30 days supply | Qty: 30 | Fill #0 | Status: TO

## 2016-06-17 MED FILL — CARISOPRODOL 350 MG TABLET: 350 | 20 days supply | Qty: 40 | Fill #0

## 2016-06-17 NOTE — Patient Instructions (Addendum)
Start taking Vitamin D 2,000 IU daily.  Preventive Care 40-64 Years, Female Preventive care refers to lifestyle choices and visits with your health care provider that can promote health and wellness. What does preventive care include?  A yearly physical exam. This is also called an annual well check.  Dental exams once or twice a year.  Routine eye exams. Ask your health care provider how often you should have your eyes checked.  Personal lifestyle choices, including:  Daily care of your teeth and gums.  Regular physical activity.  Eating a healthy diet.  Avoiding tobacco and drug use.  Limiting alcohol use.  Practicing safe sex.  Taking low-dose aspirin daily starting at age 62.  Taking vitamin and mineral supplements as recommended by your health care provider. What happens during an annual well check? The services and screenings done by your health care provider during your annual well check will depend on your age, overall health, lifestyle risk factors, and family history of disease. Counseling  Your health care provider may ask you questions about your:  Alcohol use.  Tobacco use.  Drug use.  Emotional well-being.  Home and relationship well-being.  Sexual activity.  Eating habits.  Work and work Statistician.  Method of birth control.  Menstrual cycle.  Pregnancy history. Screening  You may have the following tests or measurements:  Height, weight, and BMI.  Blood pressure.  Lipid and cholesterol levels. These may be checked every 5 years, or more frequently if you are over 35 years old.  Skin check.  Lung cancer screening. You may have this screening every year starting at age 1 if you have a 30-pack-year history of smoking and currently smoke or have quit within the past 15 years.  Fecal occult blood test (FOBT) of the stool. You may have this test every year starting at age 28.  Flexible sigmoidoscopy or colonoscopy. You may have a  sigmoidoscopy every 5 years or a colonoscopy every 10 years starting at age 17.  Hepatitis C blood test.  Hepatitis B blood test.  Sexually transmitted disease (STD) testing.  Diabetes screening. This is done by checking your blood sugar (glucose) after you have not eaten for a while (fasting). You may have this done every 1-3 years.  Mammogram. This may be done every 1-2 years. Talk to your health care provider about when you should start having regular mammograms. This may depend on whether you have a family history of breast cancer.  BRCA-related cancer screening. This may be done if you have a family history of breast, ovarian, tubal, or peritoneal cancers.  Pelvic exam and Pap test. This may be done every 3 years starting at age 35. Starting at age 55, this may be done every 5 years if you have a Pap test in combination with an HPV test.  Bone density scan. This is done to screen for osteoporosis. You may have this scan if you are at high risk for osteoporosis. Discuss your test results, treatment options, and if necessary, the need for more tests with your health care provider. Vaccines  Your health care provider may recommend certain vaccines, such as:  Influenza vaccine. This is recommended every year.  Tetanus, diphtheria, and acellular pertussis (Tdap, Td) vaccine. You may need a Td booster every 10 years.  Varicella vaccine. You may need this if you have not been vaccinated.  Zoster vaccine. You may need this after age 68.  Measles, mumps, and rubella (MMR) vaccine. You may need at least  one dose of MMR if you were born in 1957 or later. You may also need a second dose.  Pneumococcal 13-valent conjugate (PCV13) vaccine. You may need this if you have certain conditions and were not previously vaccinated.  Pneumococcal polysaccharide (PPSV23) vaccine. You may need one or two doses if you smoke cigarettes or if you have certain conditions.  Meningococcal vaccine. You may  need this if you have certain conditions.  Hepatitis A vaccine. You may need this if you have certain conditions or if you travel or work in places where you may be exposed to hepatitis A.  Hepatitis B vaccine. You may need this if you have certain conditions or if you travel or work in places where you may be exposed to hepatitis B.  Haemophilus influenzae type b (Hib) vaccine. You may need this if you have certain conditions. Talk to your health care provider about which screenings and vaccines you need and how often you need them. This information is not intended to replace advice given to you by your health care provider. Make sure you discuss any questions you have with your health care provider. Document Released: 06/27/2015 Document Revised: 02/18/2016 Document Reviewed: 04/01/2015 Elsevier Interactive Patient Education  2017 [employer].

## 2016-06-17 NOTE — Assessment & Plan Note (Signed)
Patient encouraged to maintain heart healthy diet, regular exercise, adequate sleep. Consider daily probiotics. Take medications as prescribed. Had pap last week they increased her Provera to 5 mg for spotting for 6 months then will reevaluate. Has been maintained same dose of estradiol. Given and reviewed copy of ACP documents from [employer] and encouraged to complete and return
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
## 2016-06-17 NOTE — Assessment & Plan Note (Signed)
She is in physical therapy for her left hip pain is following with Dr Ninfa Linden of ortho. Is stretching and exercising regularly  Has used Soma in past with good response is able to refill today. Given rx for Meloxicam 15 mg po daily prn and try Curcumen.

## 2016-06-17 NOTE — Progress Notes (Signed)
Pre visit review using our clinic review tool, if applicable. No additional management support is needed unless otherwise documented below in the visit note. 

## 2016-06-17 NOTE — Assessment & Plan Note (Signed)
Encouraged heart healthy diet, increase exercise, avoid trans fats, consider a krill oil cap daily 

## 2016-06-17 NOTE — Assessment & Plan Note (Signed)
Start 2000 IU daily and check level in 3- 4 months.

## 2016-06-17 NOTE — Progress Notes (Signed)
Patient ID: Shannon [name], female   DOB: May 31, 1956, 61 y.o.   MRN: [id number]

## 2016-06-18 ENCOUNTER — Encounter: Payer: Self-pay | Admitting: Physical Therapy

## 2016-06-18 ENCOUNTER — Ambulatory Visit (INDEPENDENT_AMBULATORY_CARE_PROVIDER_SITE_OTHER): Payer: 59 | Admitting: Physical Therapy

## 2016-06-18 DIAGNOSIS — M25552 Pain in left hip: Secondary | ICD-10-CM | POA: Diagnosis not present

## 2016-06-18 DIAGNOSIS — R29898 Other symptoms and signs involving the musculoskeletal system: Secondary | ICD-10-CM | POA: Diagnosis not present
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
## 2016-06-19 DIAGNOSIS — G5760 Lesion of plantar nerve, unspecified lower limb: Secondary | ICD-10-CM | POA: Insufficient documentation

## 2016-06-19 DIAGNOSIS — D649 Anemia, unspecified: Secondary | ICD-10-CM | POA: Insufficient documentation

## 2016-06-19 NOTE — Assessment & Plan Note (Signed)
Struggles with chronic pain is referred to podiatry for furhte consideration due to persistence of pain

## 2016-06-19 NOTE — Assessment & Plan Note (Signed)
Mild, new onset. Increase leafy greens, consider increased lean red meat and using cast iron cookware. Continue to monitor, report any concerns

## 2016-06-22 ENCOUNTER — Encounter: Payer: Self-pay | Admitting: Rehabilitative and Restorative Service Providers"

## 2016-06-22 ENCOUNTER — Ambulatory Visit (INDEPENDENT_AMBULATORY_CARE_PROVIDER_SITE_OTHER): Payer: 59 | Admitting: Rehabilitative and Restorative Service Providers"

## 2016-06-22 DIAGNOSIS — R29898 Other symptoms and signs involving the musculoskeletal system: Secondary | ICD-10-CM | POA: Diagnosis not present

## 2016-06-22 DIAGNOSIS — M25552 Pain in left hip: Secondary | ICD-10-CM
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
## 2016-06-24 ENCOUNTER — Ambulatory Visit (INDEPENDENT_AMBULATORY_CARE_PROVIDER_SITE_OTHER): Payer: 59 | Admitting: Rehabilitative and Restorative Service Providers"

## 2016-06-24 ENCOUNTER — Encounter: Payer: Self-pay | Admitting: Rehabilitative and Restorative Service Providers"

## 2016-06-24 DIAGNOSIS — R29898 Other symptoms and signs involving the musculoskeletal system: Secondary | ICD-10-CM | POA: Diagnosis not present

## 2016-06-24 DIAGNOSIS — M25552 Pain in left hip: Secondary | ICD-10-CM
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
## 2016-06-28 ENCOUNTER — Ambulatory Visit: Payer: 59 | Admitting: Podiatry

## 2016-06-28 ENCOUNTER — Encounter: Payer: Self-pay | Admitting: Rehabilitative and Restorative Service Providers"

## 2016-06-28 ENCOUNTER — Ambulatory Visit (INDEPENDENT_AMBULATORY_CARE_PROVIDER_SITE_OTHER): Payer: 59 | Admitting: Rehabilitative and Restorative Service Providers"

## 2016-06-28 DIAGNOSIS — R29898 Other symptoms and signs involving the musculoskeletal system: Secondary | ICD-10-CM

## 2016-06-28 DIAGNOSIS — M25552 Pain in left hip: Secondary | ICD-10-CM | POA: Diagnosis not present

## 2016-06-28 NOTE — Patient Instructions (Addendum)
  Angry Cat, All Fours    Kneel on hands and knees. Tuck chin and tighten stomach. Exhale and round back upward. Inhale and arch back downward. Hold each position __5=10_ seconds. Focus on arched position. Repeat _5__ times per session. Do _2-3__ sessions per day.   Reach, Kneeling    Kneel and sit backward onto heels. Push chest toward floor, reaching forward as far as possible. Hold __20-30_ seconds. Repeat _3-5__ times per session. Do _2-3__ sessions per day.  Progress to:  Kneeling With Rotation    Sit on heels. Press chest toward floor with arms reaching forward. Rotate to each side as far as possible, keeping chest low toward floor. Hold each position _10-15__ seconds. Repeat _2-3__ times per session. Do _2-3__ sessions per day.    Half Lean, Sitting    Sit on chair. Lean forward. Hold _10-20__ seconds. To return, put forearms on knees and push up. For greater stretch reach arms toward back legs of chair.  Repeat _3-5__ times per session. Do _4-5__ sessions per day.    Supine Knee-to-Chest, Bilateral    Lie on back, hands clasped behind both knees. Pull knees in toward chest until a comfortable stretch is felt in lower back and buttocks. Hold __20_ seconds. Repeat _3-5_ times per session. Do _2-3__ sessions per day.
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
## 2016-06-29 ENCOUNTER — Ambulatory Visit (INDEPENDENT_AMBULATORY_CARE_PROVIDER_SITE_OTHER): Payer: 59 | Admitting: Orthopaedic Surgery

## 2016-06-29 DIAGNOSIS — M7062 Trochanteric bursitis, left hip: Secondary | ICD-10-CM

## 2016-06-29 MED ORDER — TIZANIDINE HCL 4 MG PO TABS: 4.0000 mg | ORAL_TABLET | Freq: Two times a day (BID) | ORAL | 0 refills | Status: DC | PRN

## 2016-06-29 MED ORDER — DICLOFENAC SODIUM 75 MG PO TBEC: 75.0000 mg | DELAYED_RELEASE_TABLET | Freq: Two times a day (BID) | ORAL | 1 refills | Status: DC | PRN

## 2016-06-29 MED ORDER — METHYLPREDNISOLONE ACETATE 40 MG/ML IJ SUSP
40.0000 mg | INTRAMUSCULAR | Status: AC | PRN
  Administered 2016-06-29: 40 mg via INTRA_ARTICULAR

## 2016-06-29 MED ORDER — LIDOCAINE HCL 1 % IJ SOLN
3.0000 mL | INTRAMUSCULAR | Status: AC | PRN
  Administered 2016-06-29: 3 mL

## 2016-06-29 MED FILL — DICLOFENAC SOD 75 MG TAB EC: 75 | 30 days supply | Qty: 60 | Fill #0

## 2016-06-29 MED FILL — ESTRADIOL 0.075 MG PATCH: 0.075 | 84 days supply | Qty: 24 | Fill #0

## 2016-06-29 MED FILL — MEDROXYPROGESTERONE 5 MG TA: 5 | 90 days supply | Qty: 90 | Fill #0

## 2016-06-29 MED FILL — tiZANidine HCL 4 MG TABS: 4 | 30 days supply | Qty: 60 | Fill #0
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
## 2016-07-01 ENCOUNTER — Encounter: Payer: 59 | Admitting: Rehabilitative and Restorative Service Providers"

## 2016-07-05 ENCOUNTER — Encounter: Payer: Self-pay | Admitting: Rehabilitative and Restorative Service Providers"

## 2016-07-05 ENCOUNTER — Ambulatory Visit (INDEPENDENT_AMBULATORY_CARE_PROVIDER_SITE_OTHER): Payer: 59 | Admitting: Rehabilitative and Restorative Service Providers"

## 2016-07-05 DIAGNOSIS — R29898 Other symptoms and signs involving the musculoskeletal system: Secondary | ICD-10-CM | POA: Diagnosis not present

## 2016-07-05 DIAGNOSIS — M25552 Pain in left hip: Secondary | ICD-10-CM | POA: Diagnosis not present

## 2016-07-05 NOTE — Patient Instructions (Addendum)
Lat stretch supine 20 -30 sec hold 3 reps  2-3 times/day initially   (hand drawn diagram)
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
## 2016-07-07 MED FILL — ZOLPIDEM TARTRATE 10 MG TAB: 10 | 30 days supply | Qty: 30 | Fill #0

## 2016-07-09 ENCOUNTER — Encounter: Payer: 59 | Admitting: Rehabilitative and Restorative Service Providers"

## 2016-07-27 ENCOUNTER — Telehealth: Payer: Self-pay | Admitting: *Deleted

## 2016-07-27 ENCOUNTER — Ambulatory Visit (INDEPENDENT_AMBULATORY_CARE_PROVIDER_SITE_OTHER): Payer: 59 | Admitting: Podiatry

## 2016-07-27 ENCOUNTER — Ambulatory Visit (HOSPITAL_BASED_OUTPATIENT_CLINIC_OR_DEPARTMENT_OTHER)
Admission: RE | Admit: 2016-07-27 | Discharge: 2016-07-27 | Disposition: A | Discharge status: Home / Self Care | Payer: 59 | Source: Ambulatory Visit | Attending: Podiatry | Admitting: Podiatry

## 2016-07-27 ENCOUNTER — Encounter: Payer: Self-pay | Admitting: Podiatry

## 2016-07-27 DIAGNOSIS — M19072 Primary osteoarthritis, left ankle and foot: Secondary | ICD-10-CM | POA: Insufficient documentation

## 2016-07-27 DIAGNOSIS — M79671 Pain in right foot: Secondary | ICD-10-CM | POA: Diagnosis not present

## 2016-07-27 DIAGNOSIS — M79672 Pain in left foot: Secondary | ICD-10-CM | POA: Insufficient documentation

## 2016-07-27 DIAGNOSIS — M779 Enthesopathy, unspecified: Secondary | ICD-10-CM

## 2016-07-27 DIAGNOSIS — M722 Plantar fascial fibromatosis: Secondary | ICD-10-CM

## 2016-07-27 DIAGNOSIS — M2012 Hallux valgus (acquired), left foot: Secondary | ICD-10-CM | POA: Diagnosis not present

## 2016-07-27 DIAGNOSIS — M2011 Hallux valgus (acquired), right foot: Secondary | ICD-10-CM | POA: Diagnosis not present

## 2016-07-27 DIAGNOSIS — D3610 Benign neoplasm of peripheral nerves and autonomic nervous system, unspecified: Secondary | ICD-10-CM

## 2016-07-27 DIAGNOSIS — G5760 Lesion of plantar nerve, unspecified lower limb: Secondary | ICD-10-CM | POA: Diagnosis not present

## 2016-07-27 DIAGNOSIS — M19071 Primary osteoarthritis, right ankle and foot: Secondary | ICD-10-CM | POA: Diagnosis not present

## 2016-07-27 NOTE — Progress Notes (Signed)
   Subjective:    Patient ID: Shannon [name], female    DOB: 03-25-1956, 61 y.o.   MRN: [id number]  HPI  61 year old female presents the office with concerns of bilateral foot pain. She states that she was diagnosed with a neuroma in 2011 that she's had several injections to the area. She states that she had 2 injections last year. She is also having pain to both of her heels was started in September 2017. She was diagnosed with plantar fasciitis. She was seen Dr. Gershon Mussel for this. She had an injection to the area which didn't help and she does not want another injection. She has been stretching and icing. She will do shoe market and got inserts for her shoes which provided some relief. 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 No numbness or tingling. No other complaints. The pain does not wake her up at night.   Review of Systems  All other systems reviewed and are negative.      Objective:   Physical Exam  Physical Exam General: AAO x3, NAD  Dermatological: Skin is warm, dry and supple bilateral. Nails x 10 are well manicured; remaining integument appears unremarkable at this time. There are no open sores, no preulcerative lesions, no rash or signs of infection present.  Vascular: Dorsalis Pedis artery and Posterior Tibial artery pedal pulses are 2/4 bilateral with immedate capillary fill time. There is no pain with calf compression, swelling, warmth, erythema.   Neruologic: Grossly intact via light touch bilateral. Vibratory intact via tuning fork bilateral. Protective threshold with Semmes Wienstein monofilament intact to all pedal sites bilateral.  Musculoskeletal: Tenderness to palpation along the plantar medial tubercle of the calcaneus at the insertion of plantar fascia on the left and right  foot. There is no pain along the course of the plantar fascia within the arch of the foot. Plantar fascia appears to be  intact. There is no pain with lateral compression of the calcaneus or pain with vibratory sensation. There is no pain along the course or insertion of the achilles tendon. No other areas of tenderness to bilateral lower extremities.There is no palpable neuroma present on the right foot but there is mild tenderness to palpation along the 3rd interspace. She states she gets some occasional numbness to the toes but it is not consistent. No area of pinpoint bony tenderness.  Muscular strength 5/5 in all groups tested bilateral.  Gait: Unassisted, Nonantalgic.     Assessment & Plan:  61 year old female with chronic neuroma pain right foot; plantar fasciitis -Treatment options discussed including all alternatives, risks, and complications -Etiology of symptoms were discussed -X-rays were obtained and reviewed with the patient.  -She wishes to hold off on any steroid injection today -I believe she may benefit more form a CMO. The ones she has are good for the arch but not doing anything for the neuroma or forefoot. She was scanned for them today but will check insurance coverage before sending -Will start PT -Continue with ice and stretching.  -Follow-up as scheduled or sooner if any problems arise. In the meantime, encouraged to call the office with any questions, concerns, change in symptoms.   Celesta Gentile, DPM

## 2016-07-27 NOTE — Telephone Encounter (Addendum)
-----   Message from Trula Slade, DPM sent at 07/27/2016  9:07 AM EST ----- Can you please put in for a PT consult for chronic bilateral heel pain and right neuroma? 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 Thanks. Faxed orders for PT to [facility].11/09/2016-[facility] states when they get this medication for this use it is usually 1/4 to 1/2 the dose Dr. Jacqualyn Posey has ordered, and is it to be applied to the bottom of the foot for plantar fasciitis.

## 2016-07-29 ENCOUNTER — Ambulatory Visit: Payer: 59 | Attending: Family Medicine | Admitting: Physical Therapy

## 2016-07-29 DIAGNOSIS — M25571 Pain in right ankle and joints of right foot: Secondary | ICD-10-CM | POA: Insufficient documentation

## 2016-07-29 DIAGNOSIS — R29898 Other symptoms and signs involving the musculoskeletal system: Secondary | ICD-10-CM | POA: Diagnosis not present

## 2016-07-29 DIAGNOSIS — M25552 Pain in left hip: Secondary | ICD-10-CM | POA: Insufficient documentation

## 2016-07-29 DIAGNOSIS — M25572 Pain in left ankle and joints of left foot: Secondary | ICD-10-CM | POA: Insufficient documentation

## 2016-07-29 DIAGNOSIS — R262 Difficulty in walking, not elsewhere classified: Secondary | ICD-10-CM | POA: Diagnosis not present
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
## 2016-08-05 NOTE — Addendum Note (Signed)
Addended by: Harriett Sine D on: 08/05/2016 09:18 AM   Modules accepted: Orders

## 2016-08-09 MED FILL — CARISOPRODOL 350 MG TABLET: 350 | 20 days supply | Qty: 40 | Fill #1

## 2016-08-11 ENCOUNTER — Ambulatory Visit: Payer: 59 | Admitting: Physical Therapy

## 2016-08-11 DIAGNOSIS — R262 Difficulty in walking, not elsewhere classified: Secondary | ICD-10-CM

## 2016-08-11 DIAGNOSIS — R29898 Other symptoms and signs involving the musculoskeletal system: Secondary | ICD-10-CM | POA: Diagnosis not present

## 2016-08-11 DIAGNOSIS — M25572 Pain in left ankle and joints of left foot: Secondary | ICD-10-CM | POA: Diagnosis not present

## 2016-08-11 DIAGNOSIS — M25571 Pain in right ankle and joints of right foot: Secondary | ICD-10-CM

## 2016-08-11 DIAGNOSIS — M25552 Pain in left hip: Secondary | ICD-10-CM
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
## 2016-08-16 ENCOUNTER — Ambulatory Visit: Payer: 59 | Admitting: Physical Therapy

## 2016-08-18 ENCOUNTER — Ambulatory Visit: Payer: 59 | Attending: Family Medicine | Admitting: Physical Therapy

## 2016-08-18 DIAGNOSIS — M25572 Pain in left ankle and joints of left foot: Secondary | ICD-10-CM | POA: Diagnosis not present

## 2016-08-18 DIAGNOSIS — M25552 Pain in left hip: Secondary | ICD-10-CM | POA: Insufficient documentation

## 2016-08-18 DIAGNOSIS — M25571 Pain in right ankle and joints of right foot: Secondary | ICD-10-CM | POA: Diagnosis not present

## 2016-08-18 DIAGNOSIS — R29898 Other symptoms and signs involving the musculoskeletal system: Secondary | ICD-10-CM | POA: Insufficient documentation

## 2016-08-18 DIAGNOSIS — R262 Difficulty in walking, not elsewhere classified: Secondary | ICD-10-CM | POA: Diagnosis not present
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
## 2016-08-19 ENCOUNTER — Ambulatory Visit: Payer: 59 | Admitting: Physical Therapy

## 2016-08-19 DIAGNOSIS — M25572 Pain in left ankle and joints of left foot: Secondary | ICD-10-CM

## 2016-08-19 DIAGNOSIS — M25552 Pain in left hip: Secondary | ICD-10-CM

## 2016-08-19 DIAGNOSIS — R262 Difficulty in walking, not elsewhere classified: Secondary | ICD-10-CM | POA: Diagnosis not present

## 2016-08-19 DIAGNOSIS — M25571 Pain in right ankle and joints of right foot: Secondary | ICD-10-CM | POA: Diagnosis not present

## 2016-08-19 DIAGNOSIS — R29898 Other symptoms and signs involving the musculoskeletal system: Secondary | ICD-10-CM | POA: Diagnosis not present
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
## 2016-08-23 ENCOUNTER — Ambulatory Visit: Payer: 59 | Admitting: Physical Therapy

## 2016-08-23 DIAGNOSIS — M25572 Pain in left ankle and joints of left foot: Secondary | ICD-10-CM | POA: Diagnosis not present

## 2016-08-23 DIAGNOSIS — M25571 Pain in right ankle and joints of right foot: Secondary | ICD-10-CM | POA: Diagnosis not present

## 2016-08-23 DIAGNOSIS — R262 Difficulty in walking, not elsewhere classified: Secondary | ICD-10-CM | POA: Diagnosis not present

## 2016-08-23 DIAGNOSIS — M25552 Pain in left hip: Secondary | ICD-10-CM | POA: Diagnosis not present

## 2016-08-23 DIAGNOSIS — R29898 Other symptoms and signs involving the musculoskeletal system: Secondary | ICD-10-CM | POA: Diagnosis not present
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
## 2016-08-24 MED FILL — MELOXICAM 15 MG TABLET: 15 | 30 days supply | Qty: 30 | Fill #0 | Status: TO

## 2016-08-24 MED FILL — ZOLPIDEM TARTRATE 10 MG TAB: 10 | 30 days supply | Qty: 30 | Fill #1

## 2016-08-25 ENCOUNTER — Ambulatory Visit: Payer: 59 | Admitting: Physical Therapy

## 2016-08-25 DIAGNOSIS — R262 Difficulty in walking, not elsewhere classified: Secondary | ICD-10-CM

## 2016-08-25 DIAGNOSIS — R29898 Other symptoms and signs involving the musculoskeletal system: Secondary | ICD-10-CM | POA: Diagnosis not present

## 2016-08-25 DIAGNOSIS — M25571 Pain in right ankle and joints of right foot: Secondary | ICD-10-CM | POA: Diagnosis not present

## 2016-08-25 DIAGNOSIS — M25552 Pain in left hip: Secondary | ICD-10-CM | POA: Diagnosis not present

## 2016-08-25 DIAGNOSIS — M25572 Pain in left ankle and joints of left foot: Secondary | ICD-10-CM

## 2016-08-25 NOTE — Patient Instructions (Signed)
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
## 2016-09-01 ENCOUNTER — Encounter: Payer: Self-pay | Admitting: Physical Therapy

## 2016-09-01 ENCOUNTER — Ambulatory Visit: Payer: 59 | Admitting: Physical Therapy

## 2016-09-01 DIAGNOSIS — R29898 Other symptoms and signs involving the musculoskeletal system: Secondary | ICD-10-CM | POA: Diagnosis not present

## 2016-09-01 DIAGNOSIS — M25571 Pain in right ankle and joints of right foot: Secondary | ICD-10-CM | POA: Diagnosis not present

## 2016-09-01 DIAGNOSIS — R262 Difficulty in walking, not elsewhere classified: Secondary | ICD-10-CM | POA: Diagnosis not present

## 2016-09-01 DIAGNOSIS — M25552 Pain in left hip: Secondary | ICD-10-CM | POA: Diagnosis not present

## 2016-09-01 DIAGNOSIS — M25572 Pain in left ankle and joints of left foot: Secondary | ICD-10-CM
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
## 2016-09-06 ENCOUNTER — Ambulatory Visit: Payer: 59 | Admitting: Physical Therapy

## 2016-09-06 DIAGNOSIS — M25572 Pain in left ankle and joints of left foot: Secondary | ICD-10-CM

## 2016-09-06 DIAGNOSIS — M25571 Pain in right ankle and joints of right foot: Secondary | ICD-10-CM

## 2016-09-06 DIAGNOSIS — M25552 Pain in left hip: Secondary | ICD-10-CM

## 2016-09-06 DIAGNOSIS — R262 Difficulty in walking, not elsewhere classified: Secondary | ICD-10-CM | POA: Diagnosis not present

## 2016-09-06 DIAGNOSIS — R29898 Other symptoms and signs involving the musculoskeletal system: Secondary | ICD-10-CM
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
## 2016-09-07 ENCOUNTER — Ambulatory Visit (INDEPENDENT_AMBULATORY_CARE_PROVIDER_SITE_OTHER): Payer: 59 | Admitting: Podiatry

## 2016-09-07 ENCOUNTER — Encounter: Payer: Self-pay | Admitting: Podiatry

## 2016-09-07 VITALS — BP 130/79 | HR 78 | Resp 18

## 2016-09-07 DIAGNOSIS — M722 Plantar fascial fibromatosis: Secondary | ICD-10-CM | POA: Diagnosis not present

## 2016-09-07 MED ORDER — TRIAMCINOLONE ACETONIDE 10 MG/ML IJ SUSP
10.0000 mg | Freq: Once | INTRAMUSCULAR | Status: AC
  Administered 2016-09-07: 10 mg

## 2016-09-07 NOTE — Patient Instructions (Signed)

## 2016-09-08 ENCOUNTER — Ambulatory Visit: Payer: 59 | Admitting: Physical Therapy

## 2016-09-08 DIAGNOSIS — M722 Plantar fascial fibromatosis: Secondary | ICD-10-CM | POA: Insufficient documentation

## 2016-09-08 NOTE — Progress Notes (Signed)
Subjective: 61 year old female presents the office today for follow-up evaluation of bilateral foot pain. She states the majority pain distally the bottom of her left heel and she states that she has been stretching, icing and going to physical therapy and she does well however after being on her feet all day at work she is still getting pain to the heel. She presented to pick up orthotics as well. She still gets pain in the right frontal the neuroma as well. Denies any systemic complaints such as fevers, chills, nausea, vomiting. No acute changes since last appointment, and no other complaints at this time.   Objective: AAO x3, NAD DP/PT pulses palpable bilaterally, CRT less than 3 seconds Tenderness to palpation along the plantar medial tubercle of the calcaneus at the insertion of plantar fascia on the left foot. There is no pain along the course of the plantar fascia within the arch of the foot. Plantar fascia appears to be intact. There is no pain with lateral compression of the calcaneus or pain with vibratory sensation. There is no pain along the course or insertion of the achilles tendon.Also mild discomfort and third interspace of the right foot. This is minimal. There is no edema, erythema, increase in warmth. No other areas of tenderness identified bilaterally. No open lesions or pre-ulcerative lesions.  No pain with calf compression, swelling, warmth, erythema  Assessment: Left plantar fasciitis, right neuroma  Plan: -All treatment options discussed with the patient including all alternatives, risks, complications.  -Patient elects to proceed with steroid injection into the left heel. Under sterile skin preparation, a total of 2.5cc of kenalog 10, 0.5% Marcaine plain, and 2% lidocaine plain were infiltrated into the symptomatic area without complication. A band-aid was applied. Patient tolerated the injection well without complication. Post-injection care with discussed with the patient.  Discussed with the patient to ice the area over the next couple of days to help prevent a steroid flare.  -Orthotics were dispensed today. Oral and written break in instructions were discussed today. -Plantar fascial taking was applied today in the meantime. -Finish PT -Shoe changes.  -RTC in 3 weeks or sooner if needed.  -Patient encouraged to call the office with any questions, concerns, change in symptoms.   Celesta Gentile, DPM

## 2016-09-10 MED FILL — ESTRADIOL 0.075 MG PATCH: 0.075 | 84 days supply | Qty: 24 | Fill #1

## 2016-09-10 MED FILL — MEDROXYPROGESTERONE 5 MG TA: 5 | 90 days supply | Qty: 90 | Fill #1

## 2016-09-13 ENCOUNTER — Encounter: Payer: Self-pay | Admitting: Podiatry

## 2016-09-15 ENCOUNTER — Encounter: Payer: 59 | Admitting: Physical Therapy

## 2016-09-16 ENCOUNTER — Ambulatory Visit: Payer: 59 | Attending: Family Medicine | Admitting: Physical Therapy

## 2016-09-16 ENCOUNTER — Other Ambulatory Visit: Payer: 59

## 2016-09-16 DIAGNOSIS — R262 Difficulty in walking, not elsewhere classified: Secondary | ICD-10-CM | POA: Diagnosis not present

## 2016-09-16 DIAGNOSIS — M25571 Pain in right ankle and joints of right foot: Secondary | ICD-10-CM | POA: Insufficient documentation

## 2016-09-16 DIAGNOSIS — R29898 Other symptoms and signs involving the musculoskeletal system: Secondary | ICD-10-CM | POA: Diagnosis not present

## 2016-09-16 DIAGNOSIS — M25572 Pain in left ankle and joints of left foot: Secondary | ICD-10-CM | POA: Insufficient documentation

## 2016-09-16 DIAGNOSIS — M25552 Pain in left hip: Secondary | ICD-10-CM | POA: Diagnosis not present

## 2016-09-16 NOTE — Patient Instructions (Signed)
   No need to do plantar flexion with t band since you are standing and doing heel raises.    Voncille Lo, PT Certified Exercise Expert for the Aging Adult  09/16/16 8:12 AM Phone: [phone] Fax: [phone]
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
## 2016-09-21 ENCOUNTER — Encounter: Payer: Self-pay | Admitting: Podiatry

## 2016-09-21 ENCOUNTER — Ambulatory Visit (INDEPENDENT_AMBULATORY_CARE_PROVIDER_SITE_OTHER): Payer: 59 | Admitting: Podiatry

## 2016-09-21 DIAGNOSIS — M722 Plantar fascial fibromatosis: Secondary | ICD-10-CM

## 2016-09-21 MED ORDER — METHYLPREDNISOLONE 4 MG PO TBPK: ORAL_TABLET | ORAL | 0 refills | Status: DC

## 2016-09-21 MED FILL — METHYLPREDNISOLONE 4 MG TAB: 4 | 6 days supply | Qty: 21 | Fill #0

## 2016-09-22 ENCOUNTER — Ambulatory Visit: Payer: 59 | Admitting: Physical Therapy

## 2016-09-22 NOTE — Progress Notes (Signed)
Subjective: 61 year old female presents the office today for follow-up evaluation of bilateral heel pain and left side worse than right. She states the inserts are not helping. The last injection did not help as much as the first one she had previously. She states that she has pain and she stands all day at work.She presents today for follow-up. She is also asking for another injection today. Denies any systemic complaints such as fevers, chills, nausea, vomiting. No acute changes since last appointment, and no other complaints at this time.   Objective: AAO x3, NAD DP/PT pulses palpable bilaterally, CRT less than 3 seconds There is continued tenderness to palpation along the plantar medial tubercle of the calcaneus at the insertion of plantar fascia on the left foot. There is no pain on the right. There is no pain along the course of the plantar fascia within the arch of the foot. Plantar fascia appears to be intact. There is no pain with lateral compression of the calcaneus or pain with vibratory sensation. There is no pain along the course or insertion of the achilles tendon. No other areas of tenderness to bilateral lower extremities. No open lesions or pre-ulcerative lesions.  No pain with calf compression, swelling, warmth, erythema  Assessment: Left plantar fasciitis  Plan: -All treatment options discussed with the patient including all alternatives, risks, complications.  -Patient elects to proceed with steroid injection into the left heel. Under sterile skin preparation, a total of 2.5cc of kenalog 10, 0.5% Marcaine plain, and 2% lidocaine plain were infiltrated into the symptomatic area without complication. A band-aid was applied. Patient tolerated the injection well without complication. Post-injection care with discussed with the patient. Discussed with the patient to ice the area over the next couple of days to help prevent a steroid flare.  -At this time discussed further treatment  options. Discussed immobilization or EPAT. She does not want to do EPAT at this time and would like to start with immobilization. Dispensed CAM boot today.  -Her orthotics were not fitting correctly. I did cast her today with plaster and will re-order new inserts and try a different style and make them softer.  -Consider MRI if symptoms are not improving.  -Patient encouraged to call the office with any questions, concerns, change in symptoms.   Celesta Gentile, DPM

## 2016-09-24 ENCOUNTER — Ambulatory Visit: Payer: 59 | Admitting: Podiatry

## 2016-09-27 ENCOUNTER — Ambulatory Visit: Payer: 59 | Admitting: Physical Therapy

## 2016-09-27 ENCOUNTER — Encounter: Payer: Self-pay | Admitting: Physical Therapy

## 2016-09-27 DIAGNOSIS — M25552 Pain in left hip: Secondary | ICD-10-CM | POA: Diagnosis not present

## 2016-09-27 DIAGNOSIS — M25572 Pain in left ankle and joints of left foot: Secondary | ICD-10-CM

## 2016-09-27 DIAGNOSIS — R29898 Other symptoms and signs involving the musculoskeletal system: Secondary | ICD-10-CM | POA: Diagnosis not present

## 2016-09-27 DIAGNOSIS — R262 Difficulty in walking, not elsewhere classified: Secondary | ICD-10-CM | POA: Diagnosis not present

## 2016-09-27 DIAGNOSIS — M25571 Pain in right ankle and joints of right foot: Secondary | ICD-10-CM
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
## 2016-09-28 ENCOUNTER — Ambulatory Visit: Payer: 59 | Admitting: Podiatry

## 2016-10-05 ENCOUNTER — Encounter: Payer: Self-pay | Admitting: Physical Therapy

## 2016-10-05 ENCOUNTER — Ambulatory Visit: Payer: 59 | Admitting: Physical Therapy

## 2016-10-05 DIAGNOSIS — M25572 Pain in left ankle and joints of left foot: Secondary | ICD-10-CM | POA: Diagnosis not present

## 2016-10-05 DIAGNOSIS — M25552 Pain in left hip: Secondary | ICD-10-CM

## 2016-10-05 DIAGNOSIS — R29898 Other symptoms and signs involving the musculoskeletal system: Secondary | ICD-10-CM

## 2016-10-05 DIAGNOSIS — R262 Difficulty in walking, not elsewhere classified: Secondary | ICD-10-CM | POA: Diagnosis not present

## 2016-10-05 DIAGNOSIS — M25571 Pain in right ankle and joints of right foot: Secondary | ICD-10-CM | POA: Diagnosis not present
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
## 2016-10-13 ENCOUNTER — Encounter: Payer: Self-pay | Admitting: Family Medicine

## 2016-10-14 ENCOUNTER — Other Ambulatory Visit: Payer: Self-pay | Admitting: Family Medicine

## 2016-10-14 MED ORDER — CARISOPRODOL 350 MG PO TABS: 350.0000 mg | ORAL_TABLET | Freq: Two times a day (BID) | ORAL | 1 refills | Status: DC | PRN

## 2016-10-14 MED FILL — MELOXICAM 15 MG TABLET: 15 | 30 days supply | Qty: 30 | Fill #0

## 2016-10-15 MED FILL — CARISOPRODOL 350 MG TABLET: 350 | 20 days supply | Qty: 40 | Fill #0

## 2016-10-15 NOTE — Telephone Encounter (Signed)
Pt called in to make provider aware that she uses the [facility]. She would like to have Rx sent there.

## 2016-10-18 ENCOUNTER — Ambulatory Visit: Payer: 59 | Attending: Family Medicine | Admitting: Physical Therapy

## 2016-10-18 ENCOUNTER — Encounter: Payer: Self-pay | Admitting: Physical Therapy

## 2016-10-18 DIAGNOSIS — R262 Difficulty in walking, not elsewhere classified: Secondary | ICD-10-CM | POA: Diagnosis not present

## 2016-10-18 DIAGNOSIS — M25571 Pain in right ankle and joints of right foot: Secondary | ICD-10-CM | POA: Diagnosis not present

## 2016-10-18 DIAGNOSIS — M25572 Pain in left ankle and joints of left foot: Secondary | ICD-10-CM | POA: Diagnosis not present

## 2016-10-18 DIAGNOSIS — R29898 Other symptoms and signs involving the musculoskeletal system: Secondary | ICD-10-CM | POA: Diagnosis not present

## 2016-10-18 DIAGNOSIS — M25552 Pain in left hip: Secondary | ICD-10-CM | POA: Insufficient documentation
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
## 2016-11-01 ENCOUNTER — Encounter: Payer: Self-pay | Admitting: Podiatry

## 2016-11-02 ENCOUNTER — Ambulatory Visit: Payer: 59 | Admitting: Physical Therapy

## 2016-11-02 ENCOUNTER — Encounter: Payer: Self-pay | Admitting: Physical Therapy

## 2016-11-02 DIAGNOSIS — M25572 Pain in left ankle and joints of left foot: Secondary | ICD-10-CM

## 2016-11-02 DIAGNOSIS — R29898 Other symptoms and signs involving the musculoskeletal system: Secondary | ICD-10-CM | POA: Diagnosis not present

## 2016-11-02 DIAGNOSIS — M25571 Pain in right ankle and joints of right foot: Secondary | ICD-10-CM

## 2016-11-02 DIAGNOSIS — M25552 Pain in left hip: Secondary | ICD-10-CM

## 2016-11-02 DIAGNOSIS — R262 Difficulty in walking, not elsewhere classified: Secondary | ICD-10-CM
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
## 2016-11-03 ENCOUNTER — Telehealth: Payer: Self-pay | Admitting: *Deleted

## 2016-11-03 NOTE — Telephone Encounter (Signed)
Called patient and spoke with the patient about the patch she had stated to me in the [place] and I told her I would get back to her about it after I talked to Dr Jacqualyn Posey. Per Dr Jacqualyn Posey he would have the patient come in and they could talk about the patch and I stated that to the patient and patient stated that she would consider and would call us back. Lattie Haw

## 2016-11-09 ENCOUNTER — Other Ambulatory Visit: Payer: Self-pay | Admitting: Podiatry

## 2016-11-09 MED ORDER — NITROGLYCERIN 0.4 MG/HR TD PT24: 0.4000 mg | MEDICATED_PATCH | Freq: Every day | TRANSDERMAL | 0 refills | Status: DC

## 2016-11-09 MED ORDER — NITROGLYCERIN 0.2 MG/HR TD PT24
0.2000 mg | MEDICATED_PATCH | Freq: Every day | TRANSDERMAL | 12 refills | Status: DC
Start: 2016-11-09 — End: 2017-06-24

## 2016-11-09 NOTE — Progress Notes (Signed)
Patient was inquiring about the use of nitro patches for plantar fasciitis. Discussed with her that this is an off label use of this medication and the studies are mixed. However at this time, given her ongoing symptoms, we will try. Directed on use. If there are any issues to call the office immediately.   Celesta Gentile, DPM

## 2016-11-09 NOTE — Telephone Encounter (Signed)
Please do 0.2mg  daily. She can apply to the plantar fascial. Please let her know. Thanks.

## 2016-11-10 ENCOUNTER — Telehealth: Payer: Self-pay | Admitting: *Deleted

## 2016-11-10 MED FILL — NITROGLYCERIN 0.2 MG/HR PTC: 0.2 | 30 days supply | Qty: 30 | Fill #0

## 2016-11-10 NOTE — Telephone Encounter (Signed)
Called patient yesterday and spoke with her about the patches that Dr Jacqualyn Posey sent over to the pharmacy and patient stated that the left insert was fine and the right was hurting some and noticed that there was not a pad in this one like the last one and I stated to the patient to try it over the weekend as well as the patches and see how she did and if no better on the insert to call and we would send the right insert back and have a pad put in. Shannon [name]

## 2016-11-15 MED FILL — ZOLPIDEM TARTRATE 10 MG TAB: 10 | 30 days supply | Qty: 30 | Fill #2

## 2016-11-16 ENCOUNTER — Encounter: Payer: Self-pay | Admitting: Physical Therapy

## 2016-11-16 ENCOUNTER — Ambulatory Visit: Payer: 59 | Attending: Family Medicine | Admitting: Physical Therapy

## 2016-11-16 DIAGNOSIS — M25572 Pain in left ankle and joints of left foot: Secondary | ICD-10-CM | POA: Diagnosis not present

## 2016-11-16 DIAGNOSIS — R29898 Other symptoms and signs involving the musculoskeletal system: Secondary | ICD-10-CM | POA: Diagnosis not present

## 2016-11-16 DIAGNOSIS — M25571 Pain in right ankle and joints of right foot: Secondary | ICD-10-CM | POA: Insufficient documentation

## 2016-11-16 DIAGNOSIS — R262 Difficulty in walking, not elsewhere classified: Secondary | ICD-10-CM | POA: Diagnosis not present

## 2016-11-16 DIAGNOSIS — M25552 Pain in left hip: Secondary | ICD-10-CM | POA: Insufficient documentation
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
## 2016-12-03 ENCOUNTER — Telehealth: Payer: Self-pay | Admitting: *Deleted

## 2016-12-03 NOTE — Telephone Encounter (Signed)
Called and left a message for the patient to call me in [facility] Alert [phone] today-patient dropped off the right insert [facility] on Tuesday and just stated to call and I am waiting on the patient to return my call and see what is wrong with the insert. Shannon [name]

## 2016-12-07 ENCOUNTER — Telehealth: Payer: Self-pay | Admitting: *Deleted

## 2016-12-07 NOTE — Telephone Encounter (Signed)
Called patient and left message stating that the patient left her right insert at [facility] a week ago and patient has not returned my calls and just waiting on the patient to call to left me know what to do with her insert. Lattie Haw

## 2016-12-13 MED FILL — ZOLPIDEM TARTRATE 10 MG TAB: 10 | 30 days supply | Qty: 30 | Fill #3

## 2016-12-16 MED FILL — MEDROXYPROGESTERONE 5 MG TA: 5 | 90 days supply | Qty: 90 | Fill #2

## 2016-12-16 MED FILL — ESTRADIOL 0.075 MG PATCH: 0.075 | 84 days supply | Qty: 24 | Fill #2

## 2016-12-20 ENCOUNTER — Ambulatory Visit (INDEPENDENT_AMBULATORY_CARE_PROVIDER_SITE_OTHER): Payer: 59 | Admitting: Family Medicine

## 2016-12-20 VITALS — BP 122/68 | HR 66 | Temp 98.5°F | Resp 18 | Wt 145.2 lb

## 2016-12-20 DIAGNOSIS — D649 Anemia, unspecified: Secondary | ICD-10-CM | POA: Diagnosis not present

## 2016-12-20 DIAGNOSIS — M722 Plantar fascial fibromatosis: Secondary | ICD-10-CM | POA: Diagnosis not present

## 2016-12-20 DIAGNOSIS — E785 Hyperlipidemia, unspecified: Secondary | ICD-10-CM | POA: Diagnosis not present

## 2016-12-20 DIAGNOSIS — B1920 Unspecified viral hepatitis C without hepatic coma: Secondary | ICD-10-CM | POA: Diagnosis not present

## 2016-12-20 DIAGNOSIS — M7062 Trochanteric bursitis, left hip: Secondary | ICD-10-CM | POA: Diagnosis not present

## 2016-12-20 DIAGNOSIS — E559 Vitamin D deficiency, unspecified: Secondary | ICD-10-CM | POA: Diagnosis not present

## 2016-12-20 LAB — COMPREHENSIVE METABOLIC PANEL
ALT: 12 U/L (ref 0–35)
AST: 16 U/L (ref 0–37)
Albumin: 3.9 g/dL (ref 3.5–5.2)
Alkaline Phosphatase: 21 U/L — ABNORMAL LOW (ref 39–117)
BILIRUBIN TOTAL: 0.6 mg/dL (ref 0.2–1.2)
BUN: 9 mg/dL (ref 6–23)
CALCIUM: 9 mg/dL (ref 8.4–10.5)
CHLORIDE: 105 meq/L (ref 96–112)
CO2: 28 meq/L (ref 19–32)
CREATININE: 0.81 mg/dL (ref 0.40–1.20)
GFR: 76.07 mL/min (ref >60.00)
Glucose, Bld: 76 mg/dL (ref 70–99)
Potassium: 3.6 mEq/L (ref 3.5–5.1)
SODIUM: 138 meq/L (ref 135–145)
Total Protein: 6.2 g/dL (ref 6.0–8.3)

## 2016-12-20 LAB — CBC
HCT: 34.7 % — ABNORMAL LOW (ref 36.0–46.0)
Hemoglobin: 12.1 g/dL (ref 12.0–15.0)
MCHC: 34.9 g/dL (ref 30.0–36.0)
MCV: 96.5 fl (ref 78.0–100.0)
PLATELETS: 152 10*3/uL (ref 150.0–400.0)
RBC: 3.6 Mil/uL — AB (ref 3.87–5.11)
RDW: 12.8 % (ref 11.5–15.5)
WBC: 3 10*3/uL — ABNORMAL LOW (ref 4.0–10.5)

## 2016-12-20 LAB — VITAMIN D 25 HYDROXY (VIT D DEFICIENCY, FRACTURES): VITD: 40.96 ng/mL (ref 30.00–100.00)

## 2016-12-20 MED ORDER — MELOXICAM 15 MG PO TABS: 15.0000 mg | ORAL_TABLET | Freq: Every day | ORAL | 3 refills | Status: DC | PRN

## 2016-12-20 MED ORDER — CARISOPRODOL 350 MG PO TABS: 350.0000 mg | ORAL_TABLET | Freq: Two times a day (BID) | ORAL | 1 refills | Status: DC | PRN

## 2016-12-20 MED FILL — MELOXICAM 15 MG TABLET: 15 | 30 days supply | Qty: 30 | Fill #0

## 2016-12-20 MED FILL — CARISOPRODOL 350 MG TABLET: 350 | 20 days supply | Qty: 40 | Fill #1

## 2016-12-20 NOTE — Assessment & Plan Note (Signed)
Improved with PT and sports med

## 2016-12-20 NOTE — Assessment & Plan Note (Signed)
Check Vitamin D level today.  
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
## 2016-12-20 NOTE — Assessment & Plan Note (Signed)
Doing well, labs wnl

## 2016-12-20 NOTE — Assessment & Plan Note (Signed)
Increase leafy greens, consider increased lean red meat and using cast iron cookware. Continue to monitor, report any concerns 

## 2016-12-20 NOTE — Assessment & Plan Note (Signed)
L>R is currently using Nitroglycerin patch, has been alternately Ibuprofen and Meloxicam and using muscle relaxer infrequently and gets minimal relief. Add Tylenol and Lidocaine gel.

## 2016-12-20 NOTE — Patient Instructions (Addendum)
Tylenol bid, Lidocaine gel Shingrix shingles shot   Anemia, Nonspecific Anemia is a condition in which the concentration of red blood cells or hemoglobin in the blood is below normal. Hemoglobin is a substance in red blood cells that carries oxygen to the tissues of the body. Anemia results in not enough oxygen reaching these tissues. What are the causes? Common causes of anemia include:  Excessive bleeding. Bleeding may be internal or external. This includes excessive bleeding from periods (in women) or from the intestine.  Poor nutrition.  Chronic kidney, thyroid, and liver disease.  Bone marrow disorders that decrease red blood cell production.  Cancer and treatments for cancer.  HIV, AIDS, and their treatments.  Spleen problems that increase red blood cell destruction.  Blood disorders.  Excess destruction of red blood cells due to infection, medicines, and autoimmune disorders.  What are the signs or symptoms?  Minor weakness.  Dizziness.  Headache.  Palpitations.  Shortness of breath, especially with exercise.  Paleness.  Cold sensitivity.  Indigestion.  Nausea.  Difficulty sleeping.  Difficulty concentrating. Symptoms may occur suddenly or they may develop slowly. How is this diagnosed? Additional blood tests are often needed. These help your health care provider determine the best treatment. Your health care provider will check your stool for blood and look for other causes of blood loss. How is this treated? Treatment varies depending on the cause of the anemia. Treatment can include:  Supplements of iron, vitamin Y18, or folic acid.  Hormone medicines.  A blood transfusion. This may be needed if blood loss is severe.  Hospitalization. This may be needed if there is significant continual blood loss.  Dietary changes.  Spleen removal.  Follow these instructions at home: Keep all follow-up appointments. It often takes many weeks to correct  anemia, and having your health care provider check on your condition and your response to treatment is very important. Get help right away if:  You develop extreme weakness, shortness of breath, or chest pain.  You become dizzy or have trouble concentrating.  You develop heavy vaginal bleeding.  You develop a rash.  You have bloody or black, tarry stools.  You faint.  You vomit up blood.  You vomit repeatedly.  You have abdominal pain.  You have a fever or persistent symptoms for more than 2-3 days.  You have a fever and your symptoms suddenly get worse.  You are dehydrated. This information is not intended to replace advice given to you by your health care provider. Make sure you discuss any questions you have with your health care provider. Document Released: 07/08/2004 Document Revised: 11/12/2015 Document Reviewed: 11/24/2012 Elsevier Interactive Patient Education  2017 [employer].

## 2016-12-20 NOTE — Assessment & Plan Note (Signed)
Encouraged heart healthy diet, increase exercise, avoid trans fats, consider a krill oil cap daily 

## 2016-12-21 ENCOUNTER — Ambulatory Visit: Payer: 59 | Admitting: Family Medicine

## 2016-12-23 ENCOUNTER — Ambulatory Visit: Payer: 59 | Admitting: Family Medicine

## 2016-12-24 ENCOUNTER — Telehealth: Payer: 59 | Admitting: Family

## 2016-12-24 ENCOUNTER — Encounter: Payer: Self-pay | Admitting: Family Medicine

## 2016-12-24 ENCOUNTER — Other Ambulatory Visit: Payer: Self-pay | Admitting: Family Medicine

## 2016-12-24 DIAGNOSIS — T300 Burn of unspecified body region, unspecified degree: Secondary | ICD-10-CM

## 2016-12-24 DIAGNOSIS — L03119 Cellulitis of unspecified part of limb: Secondary | ICD-10-CM | POA: Diagnosis not present

## 2016-12-24 MED ORDER — DOXYCYCLINE HYCLATE 100 MG PO TABS: 100.0000 mg | ORAL_TABLET | Freq: Two times a day (BID) | ORAL | 0 refills | Status: DC

## 2016-12-24 MED ORDER — SILVER SULFADIAZINE 1 % EX CREA: 1.0000 "application " | TOPICAL_CREAM | Freq: Two times a day (BID) | CUTANEOUS | 0 refills | Status: DC

## 2016-12-24 MED FILL — SSD 1% CREAM: 1 | 20 days supply | Qty: 50 | Fill #0

## 2016-12-24 MED FILL — DOXYCYCLINE HYCLATE 100 MG: 100 | 10 days supply | Qty: 20 | Fill #0
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
## 2016-12-24 NOTE — Telephone Encounter (Signed)
Ok to call in silvadene

## 2017-01-04 ENCOUNTER — Telehealth: Payer: Self-pay | Admitting: *Deleted

## 2017-01-04 NOTE — Telephone Encounter (Signed)
Called and spoke with the patient and stated that the insert to her right foot was in the [facility] and could come by and pick up at the patient's convenience. Lattie Haw

## 2017-01-31 MED FILL — DICLOFENAC SODIUM 1% GEL: 1 | 10 days supply | Qty: 100 | Fill #1

## 2017-01-31 MED FILL — NITROGLYCERIN 0.2 MG/HR PTC: 0.2 | 30 days supply | Qty: 30 | Fill #1

## 2017-02-02 MED FILL — CARISOPRODOL 350 MG TABLET: 350 | 20 days supply | Qty: 40 | Fill #0

## 2017-02-08 ENCOUNTER — Telehealth: Payer: Self-pay | Admitting: *Deleted

## 2017-02-08 NOTE — Telephone Encounter (Signed)
Called patient and stated that the right insert was here in the [facility]. Lattie Haw

## 2017-02-16 ENCOUNTER — Telehealth: Payer: Self-pay | Admitting: *Deleted

## 2017-02-16 MED FILL — ZOLPIDEM TARTRATE 10 MG TAB: 10 | 30 days supply | Qty: 30 | Fill #0

## 2017-02-16 NOTE — Telephone Encounter (Signed)
Called patient and stated that the inserts are in the med center and that I would call patient back to make sure they were there. Lattie Haw

## 2017-02-22 ENCOUNTER — Encounter: Payer: Self-pay | Admitting: Podiatry

## 2017-02-23 NOTE — Telephone Encounter (Signed)
Dawn, said Lattie Haw has documented on several occasions phone calls made trying to reach patient.Marland KitchenMarland Kitchen

## 2017-03-03 MED FILL — ESTRADIOL 0.075 MG PATCH: 0.075 | 84 days supply | Qty: 24 | Fill #3

## 2017-03-03 MED FILL — MEDROXYPROGESTERONE 5 MG TA: 5 | 90 days supply | Qty: 90 | Fill #3

## 2017-03-23 IMAGING — DX DG FOOT COMPLETE 3+V*R*
3 series · 3 of 3 positions shown · non-contrast
Comparison: None.

CLINICAL DATA: Bilateral foot pain. Left heel pain for 2 weeks and
history of Morton's neuroma on the right.

EXAM:
RIGHT FOOT COMPLETE - 3+ VIEW; LEFT FOOT - COMPLETE 3+ VIEW

[foot lat]
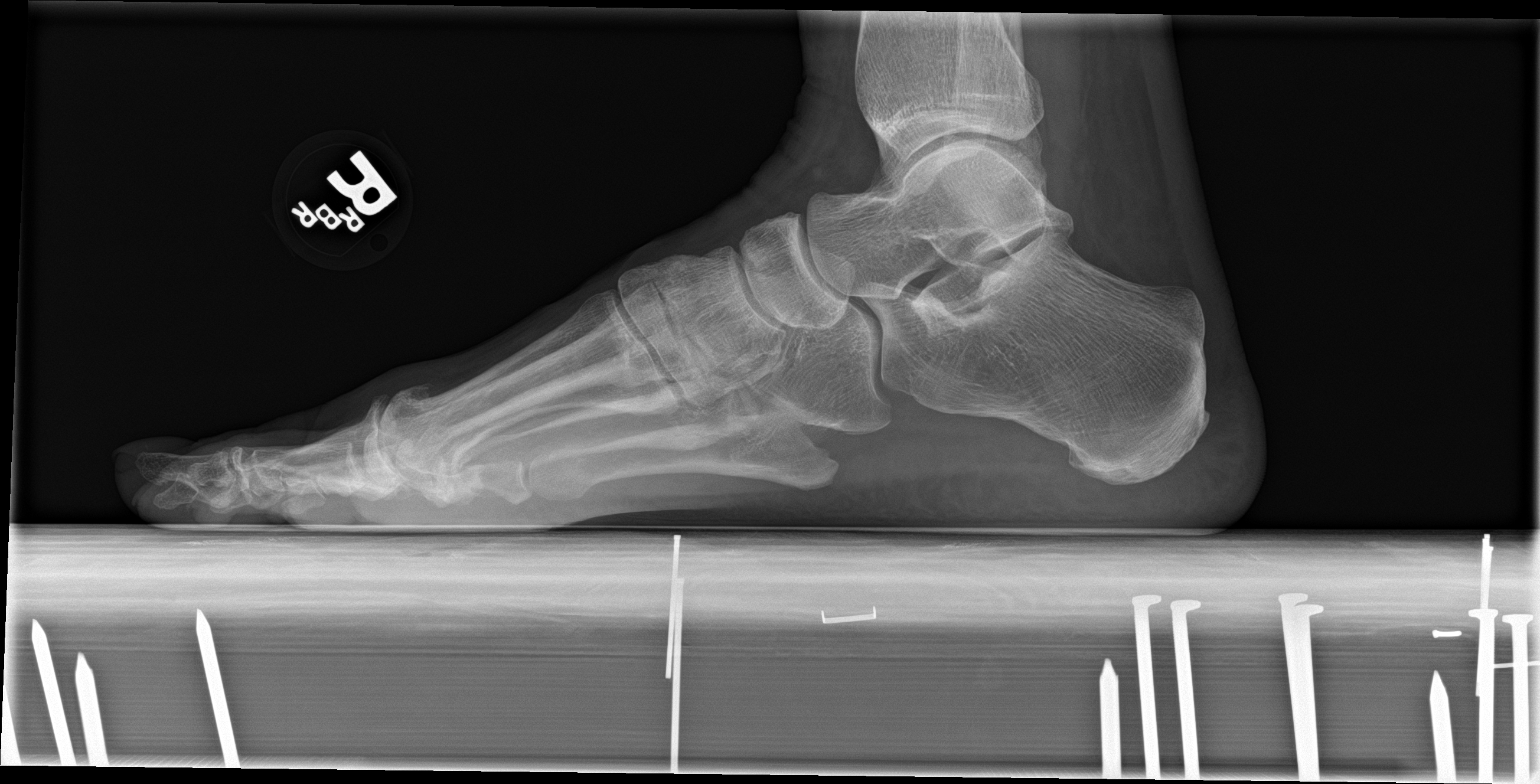

[foot ap]
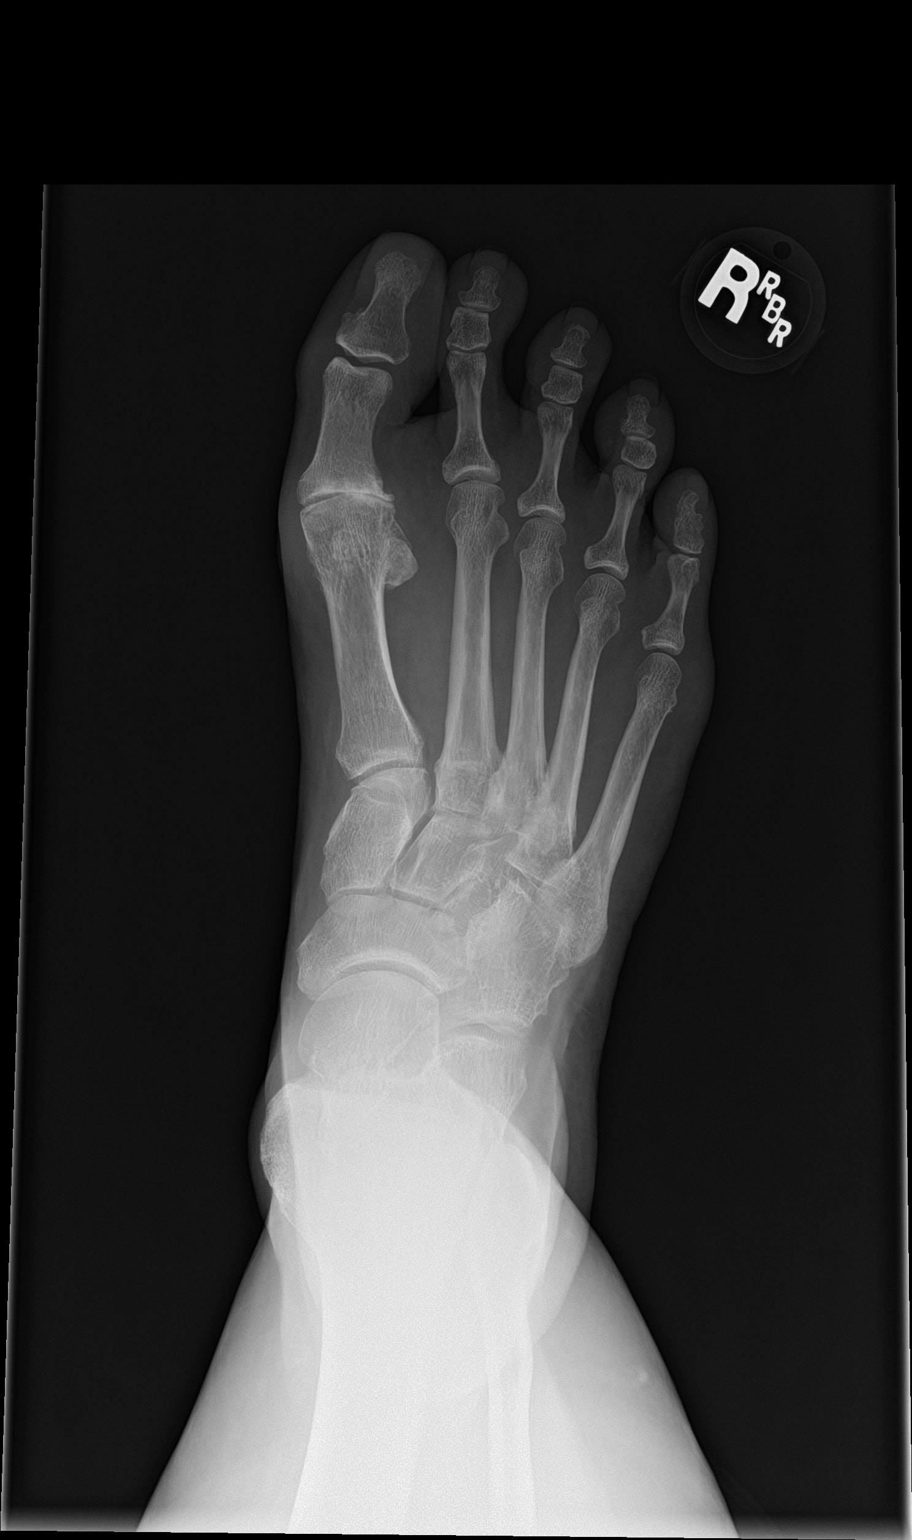

[foot obl]
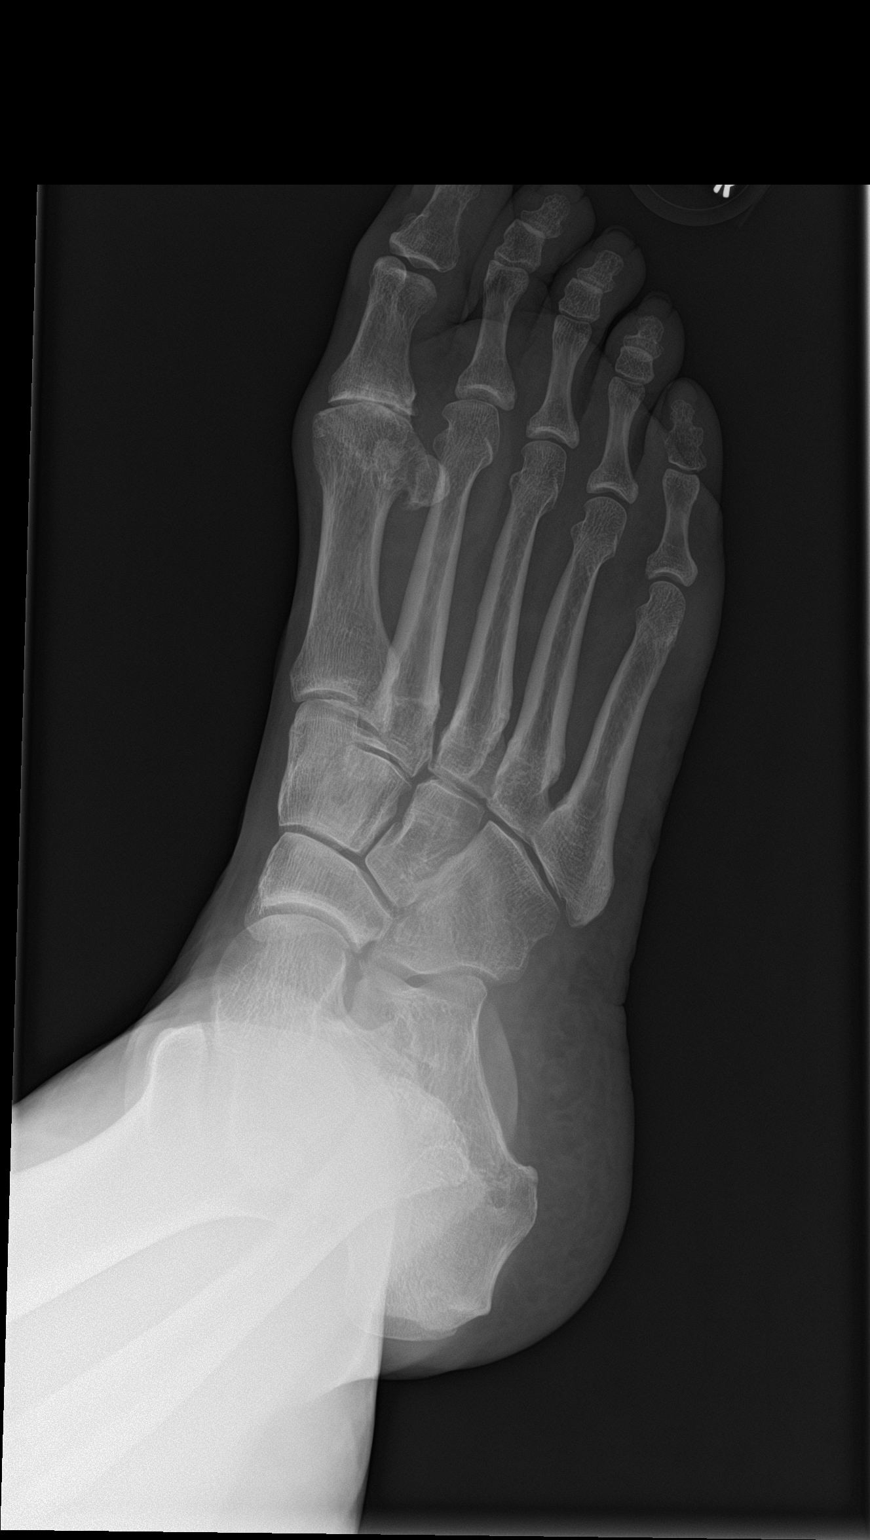

[3 of 3 positions shown; findings below may reference images not displayed]

FINDINGS: Left foot:

Moderate degenerative changes at the first metatarsal phalangeal
joint with a hallux valgus deformity. The other joint spaces are
maintained. No significant degenerative changes or findings for
erosive arthropathy. No significant calcaneal spurring changes.

Right foot

Moderate degenerative changes at the first metatarsal phalangeal
joint with hallux valgus deformity. The other joint spaces are
maintained. No acute bony findings or erosive changes. No
significant calcaneal spurring.
IMPRESSION: Bilateral first MTP joint degenerative changes and hallux valgus
deformity.

No acute bony findings or evidence of erosive arthropathy.

## 2017-04-05 MED FILL — ZOLPIDEM TARTRATE 10 MG TAB: 10 | 30 days supply | Qty: 30 | Fill #1

## 2017-04-05 MED FILL — CARISOPRODOL 350 MG TABS: 350 | 20 days supply | Qty: 40 | Fill #1

## 2017-05-26 ENCOUNTER — Encounter: Payer: Self-pay | Admitting: Family Medicine

## 2017-05-30 MED FILL — ZOLPIDEM TARTRATE 10 MG TAB: 10 | 30 days supply | Qty: 30 | Fill #2

## 2017-05-30 NOTE — Telephone Encounter (Signed)
Pt is requesting refill on Soma.   Last OV: 12/20/2016 Last Fill: 12/20/2016 #40 and 1RF UDS: None seen    Please advise.

## 2017-05-31 ENCOUNTER — Encounter: Payer: Self-pay | Admitting: Family Medicine

## 2017-05-31 MED FILL — MEDROXYPROGESTERONE 5 MG TA: 5 | 90 days supply | Qty: 90 | Fill #0 | Status: TO

## 2017-05-31 MED FILL — ESTRADIOL 0.075 MG PATCH: 0.075 | 84 days supply | Qty: 24 | Fill #0 | Status: TO

## 2017-06-06 ENCOUNTER — Other Ambulatory Visit: Payer: Self-pay

## 2017-06-06 MED ORDER — CARISOPRODOL 350 MG PO TABS: 350.0000 mg | ORAL_TABLET | Freq: Two times a day (BID) | ORAL | 1 refills | Status: DC | PRN

## 2017-06-08 MED FILL — CARISOPRODOL 350 MG TABS: 350 | 20 days supply | Qty: 40 | Fill #0

## 2017-06-08 NOTE — Telephone Encounter (Signed)
Copied from [place] [id number]. Topic: Quick Communication - Rx Refill/Question >> Jun 08, 2017 11:54 AM Marin Olp L wrote: Has the patient contacted their pharmacy? Yes.   (Agent: If no, request that the patient contact the pharmacy for the refill.) Preferred Pharmacy (with phone number or street name): [facility], [place] Agent: Please be advised that RX refills may take up to 3 business days. We ask that you follow-up with your pharmacy. Soma script sent to wrong pharm ([facility]), supposed to go to [facility]

## 2017-06-08 NOTE — Telephone Encounter (Signed)
Rx recalled into [facility].

## 2017-06-21 DIAGNOSIS — Z01419 Encounter for gynecological examination (general) (routine) without abnormal findings: Secondary | ICD-10-CM | POA: Diagnosis not present

## 2017-06-21 DIAGNOSIS — F5101 Primary insomnia: Secondary | ICD-10-CM | POA: Diagnosis not present

## 2017-06-21 DIAGNOSIS — Z79890 Hormone replacement therapy (postmenopausal): Secondary | ICD-10-CM | POA: Diagnosis not present

## 2017-06-24 ENCOUNTER — Ambulatory Visit (INDEPENDENT_AMBULATORY_CARE_PROVIDER_SITE_OTHER): Payer: 59 | Admitting: Family Medicine

## 2017-06-24 ENCOUNTER — Encounter: Payer: Self-pay | Admitting: Family Medicine

## 2017-06-24 VITALS — BP 110/64 | HR 71 | Temp 98.3°F | Resp 18 | Ht 64.0 in | Wt 141.4 lb

## 2017-06-24 DIAGNOSIS — G4700 Insomnia, unspecified: Secondary | ICD-10-CM | POA: Insufficient documentation

## 2017-06-24 DIAGNOSIS — Z0000 Encounter for general adult medical examination without abnormal findings: Secondary | ICD-10-CM

## 2017-06-24 DIAGNOSIS — E2839 Other primary ovarian failure: Secondary | ICD-10-CM | POA: Diagnosis not present

## 2017-06-24 DIAGNOSIS — E559 Vitamin D deficiency, unspecified: Secondary | ICD-10-CM

## 2017-06-24 DIAGNOSIS — D649 Anemia, unspecified: Secondary | ICD-10-CM | POA: Diagnosis not present

## 2017-06-24 DIAGNOSIS — F5105 Insomnia due to other mental disorder: Secondary | ICD-10-CM

## 2017-06-24 DIAGNOSIS — E785 Hyperlipidemia, unspecified: Secondary | ICD-10-CM | POA: Diagnosis not present

## 2017-06-24 HISTORY — DX: Insomnia due to other mental disorder: F51.05

## 2017-06-24 LAB — LIPID PANEL
Cholesterol: 216 mg/dL — ABNORMAL HIGH (ref 0–200)
HDL: 59.6 mg/dL (ref >39.00)
LDL CALC: 144 mg/dL — AB (ref 0–99)
NonHDL: 156.07
TRIGLYCERIDES: 60 mg/dL (ref 0.0–149.0)
Total CHOL/HDL Ratio: 4
VLDL: 12 mg/dL (ref 0.0–40.0)

## 2017-06-24 LAB — COMPREHENSIVE METABOLIC PANEL
ALT: 14 U/L (ref 0–35)
AST: 18 U/L (ref 0–37)
Albumin: 4.3 g/dL (ref 3.5–5.2)
Alkaline Phosphatase: 24 U/L — ABNORMAL LOW (ref 39–117)
BUN: 9 mg/dL (ref 6–23)
CHLORIDE: 101 meq/L (ref 96–112)
CO2: 29 mEq/L (ref 19–32)
Calcium: 9.2 mg/dL (ref 8.4–10.5)
Creatinine, Ser: 0.8 mg/dL (ref 0.40–1.20)
GFR: 77.04 mL/min (ref >60.00)
Glucose, Bld: 97 mg/dL (ref 70–99)
POTASSIUM: 4.1 meq/L (ref 3.5–5.1)
SODIUM: 135 meq/L (ref 135–145)
Total Bilirubin: 0.8 mg/dL (ref 0.2–1.2)
Total Protein: 7 g/dL (ref 6.0–8.3)

## 2017-06-24 LAB — CBC
HEMATOCRIT: 39.9 % (ref 36.0–46.0)
HEMOGLOBIN: 13.5 g/dL (ref 12.0–15.0)
MCHC: 33.8 g/dL (ref 30.0–36.0)
MCV: 97.1 fl (ref 78.0–100.0)
Platelets: 150 10*3/uL (ref 150.0–400.0)
RBC: 4.11 Mil/uL (ref 3.87–5.11)
RDW: 13.1 % (ref 11.5–15.5)
WBC: 3.8 10*3/uL — ABNORMAL LOW (ref 4.0–10.5)

## 2017-06-24 LAB — VITAMIN D 25 HYDROXY (VIT D DEFICIENCY, FRACTURES): VITD: 33.72 ng/mL (ref 30.00–100.00)

## 2017-06-24 LAB — TSH: TSH: 1.18 u[IU]/mL (ref 0.35–4.50)
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
## 2017-06-24 NOTE — Assessment & Plan Note (Signed)
Encouraged good sleep hygiene such as dark, quiet room. No blue/green glowing lights such as computer screens in bedroom. No alcohol or stimulants in evening. Cut down on caffeine as able. Regular exercise is helpful but not just prior to bed time. Uses Ambien intermittently with good results does not take regularly

## 2017-06-24 NOTE — Assessment & Plan Note (Addendum)
Patient encouraged to maintain heart healthy diet, regular exercise, adequate sleep. Consider daily probiotics. Take medications as prescribed. Encouraged Dexa scan today. Will return for Shingrix

## 2017-06-24 NOTE — Assessment & Plan Note (Signed)
Mild repeat labs

## 2017-06-24 NOTE — Assessment & Plan Note (Signed)
Encouraged heart healthy diet, increase exercise, avoid trans fats, consider a krill oil cap daily 

## 2017-06-24 NOTE — Patient Instructions (Addendum)
Upload advanced directives into chart Send a mychart message In February if you want Shingrix and we will let you know when to come in  Preventive Care 40-64 Years, Female Preventive care refers to lifestyle choices and visits with your health care provider that can promote health and wellness. What does preventive care include?  A yearly physical exam. This is also called an annual well check.  Dental exams once or twice a year.  Routine eye exams. Ask your health care provider how often you should have your eyes checked.  Personal lifestyle choices, including: ? Daily care of your teeth and gums. ? Regular physical activity. ? Eating a healthy diet. ? Avoiding tobacco and drug use. ? Limiting alcohol use. ? Practicing safe sex. ? Taking low-dose aspirin daily starting at age 34. ? Taking vitamin and mineral supplements as recommended by your health care provider. What happens during an annual well check? The services and screenings done by your health care provider during your annual well check will depend on your age, overall health, lifestyle risk factors, and family history of disease. Counseling Your health care provider may ask you questions about your:  Alcohol use.  Tobacco use.  Drug use.  Emotional well-being.  Home and relationship well-being.  Sexual activity.  Eating habits.  Work and work Statistician.  Method of birth control.  Menstrual cycle.  Pregnancy history.  Screening You may have the following tests or measurements:  Height, weight, and BMI.  Blood pressure.  Lipid and cholesterol levels. These may be checked every 5 years, or more frequently if you are over 26 years old.  Skin check.  Lung cancer screening. You may have this screening every year starting at age 22 if you have a 30-pack-year history of smoking and currently smoke or have quit within the past 15 years.  Fecal occult blood test (FOBT) of the stool. You may have this  test every year starting at age 38.  Flexible sigmoidoscopy or colonoscopy. You may have a sigmoidoscopy every 5 years or a colonoscopy every 10 years starting at age 26.  Hepatitis C blood test.  Hepatitis B blood test.  Sexually transmitted disease (STD) testing.  Diabetes screening. This is done by checking your blood sugar (glucose) after you have not eaten for a while (fasting). You may have this done every 1-3 years.  Mammogram. This may be done every 1-2 years. Talk to your health care provider about when you should start having regular mammograms. This may depend on whether you have a family history of breast cancer.  BRCA-related cancer screening. This may be done if you have a family history of breast, ovarian, tubal, or peritoneal cancers.  Pelvic exam and Pap test. This may be done every 3 years starting at age 44. Starting at age 84, this may be done every 5 years if you have a Pap test in combination with an HPV test.  Bone density scan. This is done to screen for osteoporosis. You may have this scan if you are at high risk for osteoporosis.  Discuss your test results, treatment options, and if necessary, the need for more tests with your health care provider. Vaccines Your health care provider may recommend certain vaccines, such as:  Influenza vaccine. This is recommended every year.  Tetanus, diphtheria, and acellular pertussis (Tdap, Td) vaccine. You may need a Td booster every 10 years.  Varicella vaccine. You may need this if you have not been vaccinated.  Zoster vaccine. You may  need this after age 60.  Measles, mumps, and rubella (MMR) vaccine. You may need at least one dose of MMR if you were born in 1957 or later. You may also need a second dose.  Pneumococcal 13-valent conjugate (PCV13) vaccine. You may need this if you have certain conditions and were not previously vaccinated.  Pneumococcal polysaccharide (PPSV23) vaccine. You may need one or two doses  if you smoke cigarettes or if you have certain conditions.  Meningococcal vaccine. You may need this if you have certain conditions.  Hepatitis A vaccine. You may need this if you have certain conditions or if you travel or work in places where you may be exposed to hepatitis A.  Hepatitis B vaccine. You may need this if you have certain conditions or if you travel or work in places where you may be exposed to hepatitis B.  Haemophilus influenzae type b (Hib) vaccine. You may need this if you have certain conditions.  Talk to your health care provider about which screenings and vaccines you need and how often you need them. This information is not intended to replace advice given to you by your health care provider. Make sure you discuss any questions you have with your health care provider. Document Released: 06/27/2015 Document Revised: 02/18/2016 Document Reviewed: 04/01/2015 Elsevier Interactive Patient Education  2018 [employer]  

## 2017-06-24 NOTE — Assessment & Plan Note (Signed)
Continue daily supplements 

## 2017-06-30 ENCOUNTER — Ambulatory Visit (HOSPITAL_BASED_OUTPATIENT_CLINIC_OR_DEPARTMENT_OTHER)
Admission: RE | Admit: 2017-06-30 | Discharge: 2017-06-30 | Disposition: A | Discharge status: Home / Self Care | Payer: 59 | Source: Ambulatory Visit | Attending: Family Medicine | Admitting: Family Medicine

## 2017-06-30 DIAGNOSIS — Z780 Asymptomatic menopausal state: Secondary | ICD-10-CM | POA: Diagnosis not present

## 2017-06-30 DIAGNOSIS — Z87891 Personal history of nicotine dependence: Secondary | ICD-10-CM | POA: Insufficient documentation

## 2017-06-30 DIAGNOSIS — E2839 Other primary ovarian failure: Secondary | ICD-10-CM | POA: Diagnosis not present

## 2017-06-30 DIAGNOSIS — Z13820 Encounter for screening for osteoporosis: Secondary | ICD-10-CM | POA: Diagnosis not present

## 2017-07-21 MED FILL — ZOLPIDEM TARTRATE 10 MG TAB: 10 | 30 days supply | Qty: 30 | Fill #3

## 2017-07-27 MED FILL — CARISOPRODOL 350 MG TABS: 350 | 20 days supply | Qty: 40 | Fill #1

## 2017-08-22 ENCOUNTER — Other Ambulatory Visit: Payer: Self-pay | Admitting: Family Medicine

## 2017-08-22 DIAGNOSIS — Z1231 Encounter for screening mammogram for malignant neoplasm of breast: Secondary | ICD-10-CM

## 2017-08-25 MED FILL — ZOLPIDEM TARTRATE 10 MG TAB: 10 | 30 days supply | Qty: 30 | Fill #0

## 2017-08-26 ENCOUNTER — Ambulatory Visit (HOSPITAL_BASED_OUTPATIENT_CLINIC_OR_DEPARTMENT_OTHER)
Admission: RE | Admit: 2017-08-26 | Discharge: 2017-08-26 | Disposition: A | Discharge status: Home / Self Care | Payer: 59 | Source: Ambulatory Visit | Attending: Family Medicine | Admitting: Family Medicine

## 2017-08-26 DIAGNOSIS — Z1231 Encounter for screening mammogram for malignant neoplasm of breast: Secondary | ICD-10-CM | POA: Insufficient documentation

## 2017-08-29 ENCOUNTER — Encounter: Payer: Self-pay | Admitting: Family Medicine
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
## 2017-09-01 ENCOUNTER — Ambulatory Visit: Payer: 59 | Admitting: Family Medicine

## 2017-09-02 ENCOUNTER — Other Ambulatory Visit: Payer: Self-pay

## 2017-09-02 MED ORDER — CARISOPRODOL 350 MG PO TABS: 350.0000 mg | ORAL_TABLET | Freq: Two times a day (BID) | ORAL | 1 refills | Status: DC | PRN

## 2017-09-02 MED FILL — CARISOPRODOL 350 MG TABS: 350 | 20 days supply | Qty: 40 | Fill #0
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
## 2017-09-05 MED FILL — NITROGLYCERIN 0.2 MG/HR PTC: 0.2 | 30 days supply | Qty: 30 | Fill #2

## 2017-09-12 MED FILL — MEDROXYPROGESTERONE 5 MG TA: 5 | 90 days supply | Qty: 90 | Fill #0 | Status: TO

## 2017-09-12 MED FILL — ESTRADIOL 0.075 MG PATCH: 0.075 | 84 days supply | Qty: 24 | Fill #0 | Status: TO

## 2017-09-23 MED FILL — MELOXICAM 15 MG TABLET: 15 | 30 days supply | Qty: 30 | Fill #1

## 2017-10-14 MED FILL — ZOLPIDEM TARTRATE 10 MG TAB: 10 | 30 days supply | Qty: 30 | Fill #1

## 2017-10-19 MED FILL — CARISOPRODOL 350 MG TABLET: 350 | 20 days supply | Qty: 40 | Fill #1

## 2017-11-01 MED FILL — MELOXICAM 15 MG TABLET: 15 | 30 days supply | Qty: 30 | Fill #2

## 2017-11-16 MED FILL — ZOLPIDEM TARTRATE 10 MG TAB: 10 | 30 days supply | Qty: 30 | Fill #2

## 2017-11-28 MED FILL — ESTRADIOL 0.075 MG PATCH: 0.075 | 84 days supply | Qty: 24 | Fill #0

## 2017-12-16 MED FILL — ZOLPIDEM TARTRATE 10 MG TAB: 10 | 30 days supply | Qty: 30 | Fill #3

## 2018-02-06 MED FILL — ZOLPIDEM TARTRATE 10 MG TAB: 10 | 20 days supply | Qty: 20 | Fill #0

## 2018-02-07 ENCOUNTER — Encounter: Payer: Self-pay | Admitting: Family Medicine

## 2018-02-10 MED ORDER — MELOXICAM 15 MG PO TABS: 15.0000 mg | ORAL_TABLET | Freq: Every day | ORAL | 3 refills | Status: DC | PRN

## 2018-02-10 MED ORDER — CARISOPRODOL 350 MG PO TABS: 350.0000 mg | ORAL_TABLET | Freq: Two times a day (BID) | ORAL | 1 refills | Status: DC | PRN

## 2018-02-10 MED FILL — MELOXICAM 15 MG TABLET: 15 | 30 days supply | Qty: 30 | Fill #0

## 2018-02-10 MED FILL — CARISOPRODOL 350 MG TABS: 350 | 20 days supply | Qty: 40 | Fill #0

## 2018-02-10 NOTE — Telephone Encounter (Signed)
Requesting:Soma Contract:no UDS:no Last OV:06/24/17 Next OV:06/26/18 Last Refill:09/02/17 #[id number] Database:   Please advise

## 2018-02-15 MED FILL — ESTRADIOL 0.075 MG PATCH: 0.075 | 84 days supply | Qty: 24 | Fill #1

## 2018-02-15 MED FILL — MEDROXYPROGESTERONE 5 MG TA: 5 | 90 days supply | Qty: 90 | Fill #0

## 2018-03-16 MED FILL — CARISOPRODOL 350 MG TABS: 350 | 20 days supply | Qty: 40 | Fill #1

## 2018-03-20 MED FILL — ZOLPIDEM TARTRATE 10 MG TAB: 10 | 20 days supply | Qty: 20 | Fill #1

## 2018-04-22 IMAGING — MG DIGITAL SCREENING BILATERAL MAMMOGRAM WITH TOMO AND CAD
6 of 10 series · 6 of 30 positions shown · non-contrast
Comparison: Previous exam(s).

CLINICAL DATA: Screening.

EXAM:
DIGITAL SCREENING BILATERAL MAMMOGRAM WITH TOMO AND CAD

[R CC synth-2D]
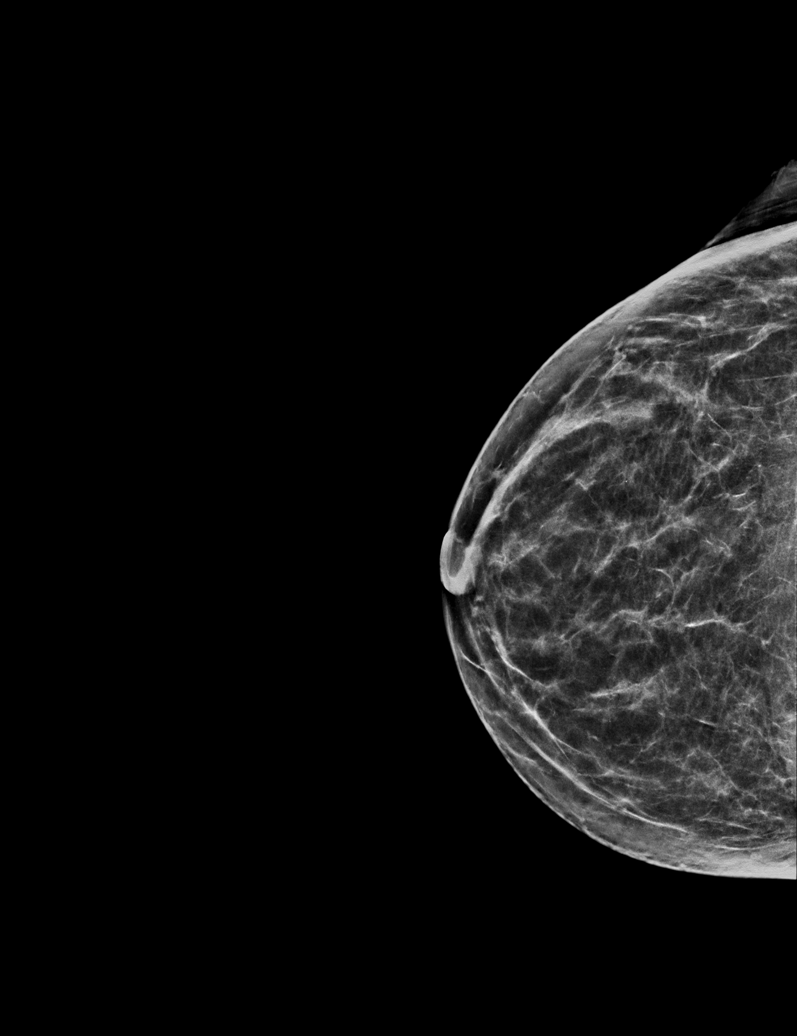

[L MLO synth-2D (1 of 2)]
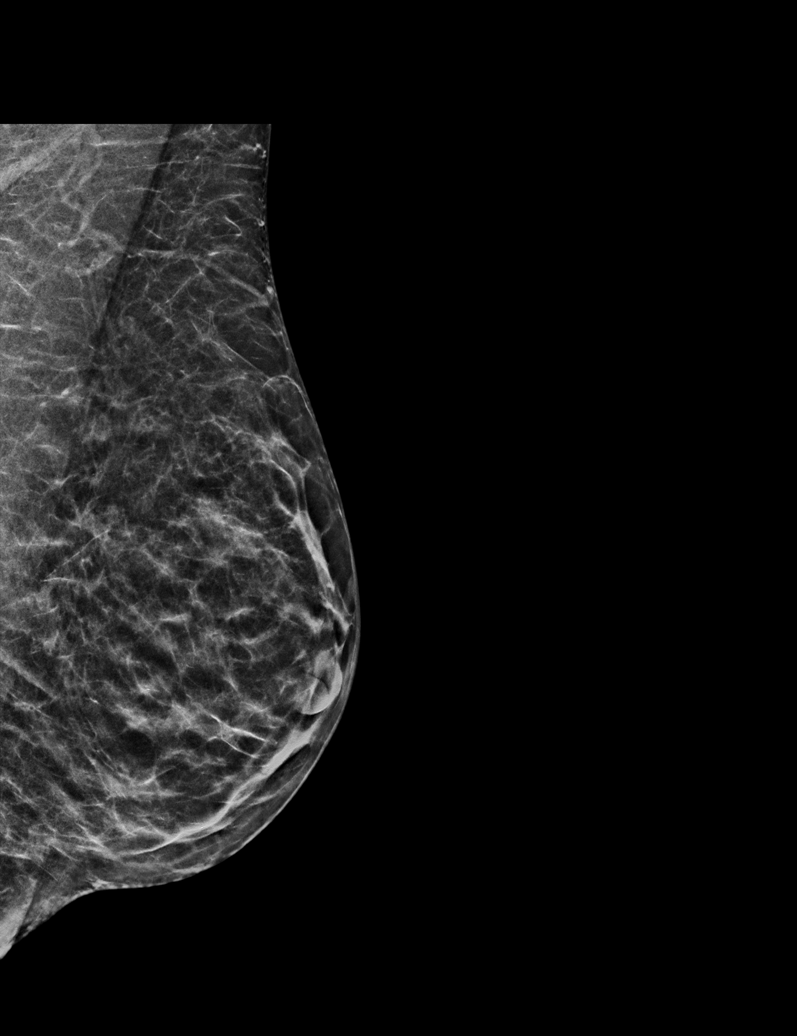

[R MLO synth-2D]
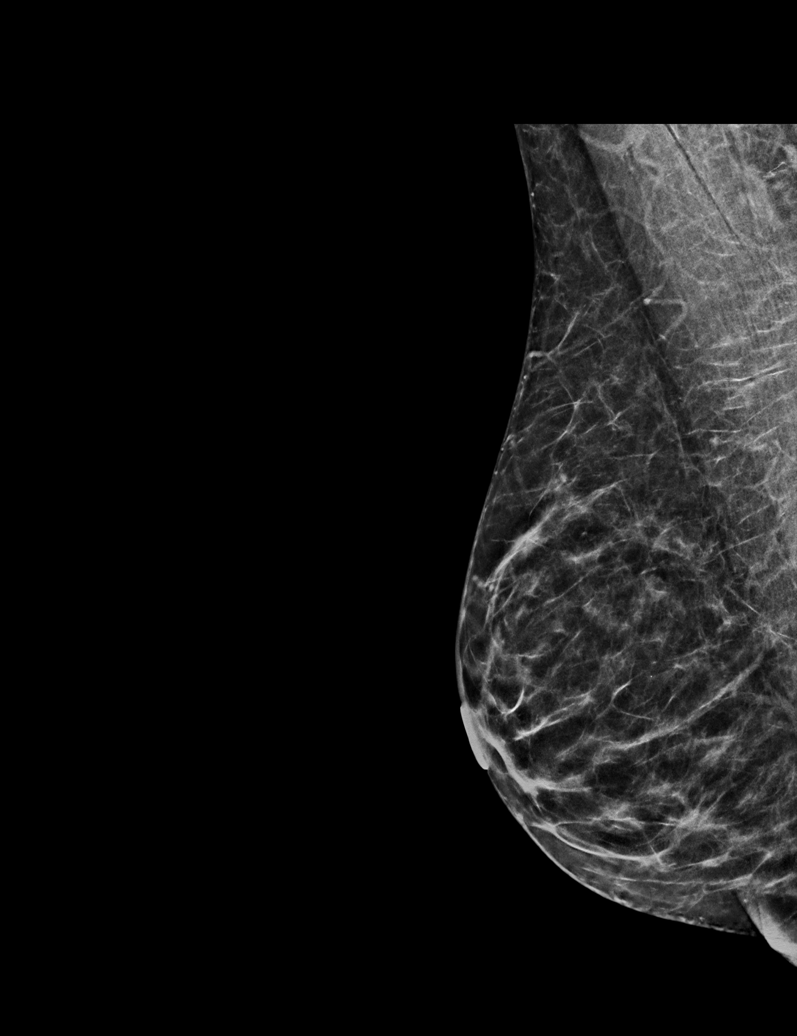

[L MLO synth-2D (2 of 2)]
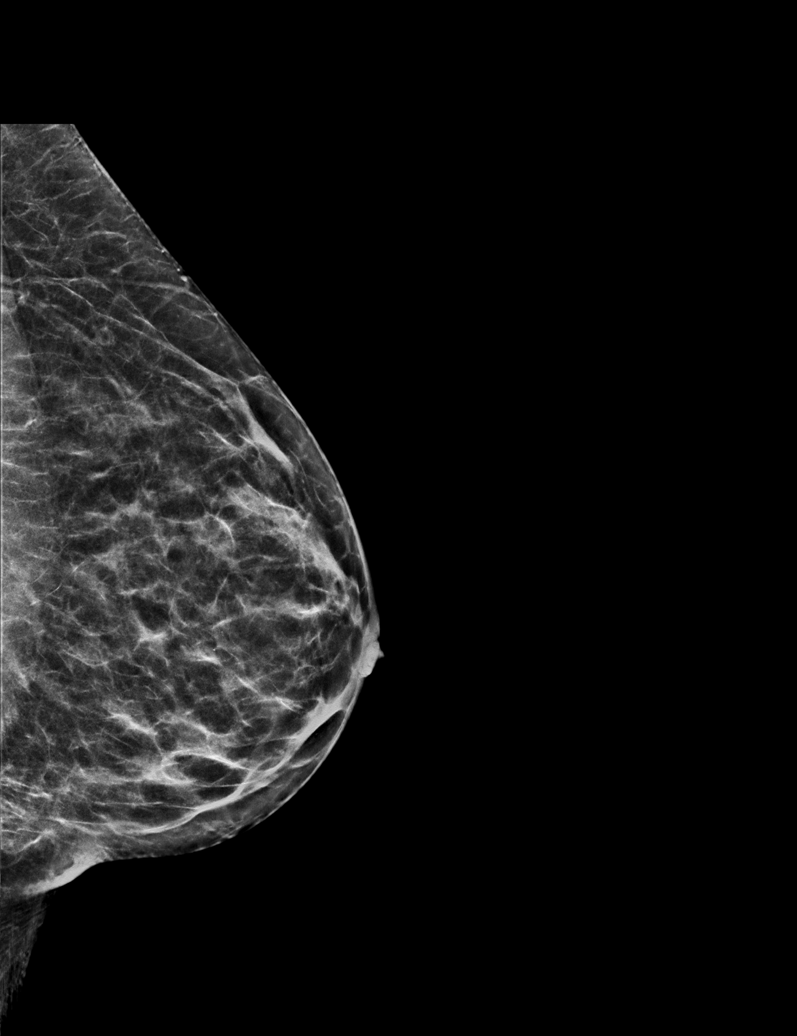

[L CC synth-2D]
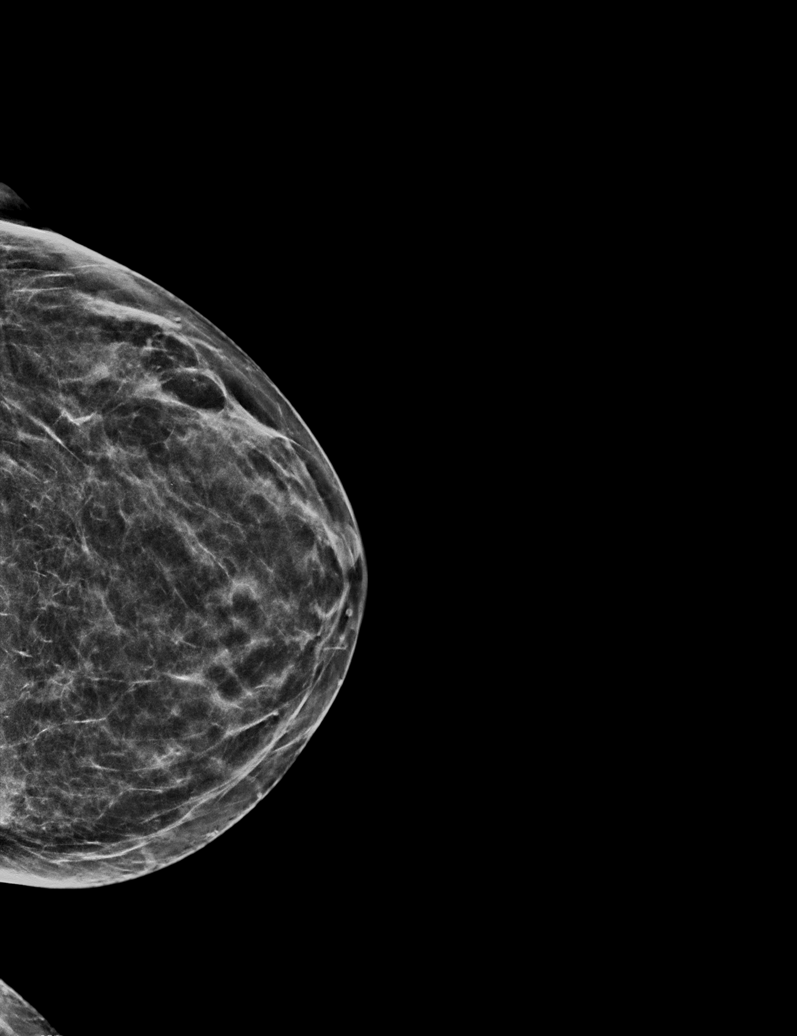

[L MLO tomo · tomo slice 27/54.0]
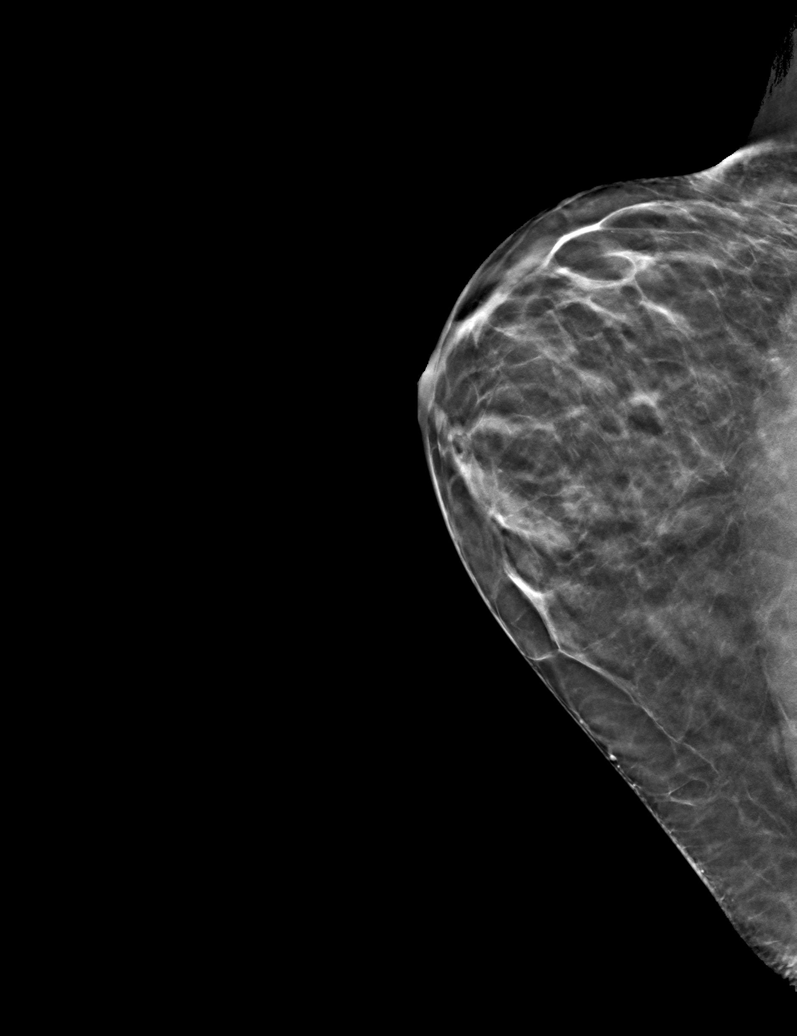

[6 of 30 positions shown; findings below may reference images not displayed]

ACR Breast Density Category b: There are scattered areas of
fibroglandular density.
FINDINGS: There are no findings suspicious for malignancy. Images were
processed with CAD.
IMPRESSION: No mammographic evidence of malignancy. A result letter of this
screening mammogram will be mailed directly to the patient.

RECOMMENDATION:
Screening mammogram in one year. (Code:[id number])

BI-RADS CATEGORY  1: Negative.

## 2018-04-24 MED FILL — ZOLPIDEM TARTRATE 10 MG TAB: 10 | 20 days supply | Qty: 20 | Fill #2

## 2018-05-05 MED FILL — ESTRADIOL 0.075 MG PATCH: 0.075 | 84 days supply | Qty: 24 | Fill #0

## 2018-05-08 MED FILL — MEDROXYPROGESTERONE 5 MG TA: 5 | 90 days supply | Qty: 90 | Fill #1

## 2018-06-06 MED FILL — ZOLPIDEM TARTRATE 10 MG TAB: 10 | 20 days supply | Qty: 20 | Fill #3

## 2018-06-26 ENCOUNTER — Ambulatory Visit (INDEPENDENT_AMBULATORY_CARE_PROVIDER_SITE_OTHER): Payer: 59 | Admitting: Family Medicine

## 2018-06-26 DIAGNOSIS — E559 Vitamin D deficiency, unspecified: Secondary | ICD-10-CM | POA: Diagnosis not present

## 2018-06-26 DIAGNOSIS — D649 Anemia, unspecified: Secondary | ICD-10-CM

## 2018-06-26 DIAGNOSIS — R002 Palpitations: Secondary | ICD-10-CM

## 2018-06-26 DIAGNOSIS — L989 Disorder of the skin and subcutaneous tissue, unspecified: Secondary | ICD-10-CM | POA: Diagnosis not present

## 2018-06-26 DIAGNOSIS — E785 Hyperlipidemia, unspecified: Secondary | ICD-10-CM | POA: Diagnosis not present

## 2018-06-26 DIAGNOSIS — M25551 Pain in right hip: Secondary | ICD-10-CM | POA: Diagnosis not present

## 2018-06-26 DIAGNOSIS — Z0000 Encounter for general adult medical examination without abnormal findings: Secondary | ICD-10-CM

## 2018-06-26 DIAGNOSIS — G4700 Insomnia, unspecified: Secondary | ICD-10-CM

## 2018-06-26 LAB — CBC
HCT: 37.9 % (ref 36.0–46.0)
Hemoglobin: 13 g/dL (ref 12.0–15.0)
MCHC: 34.3 g/dL (ref 30.0–36.0)
MCV: 96.1 fl (ref 78.0–100.0)
PLATELETS: 140 10*3/uL — AB (ref 150.0–400.0)
RBC: 3.94 Mil/uL (ref 3.87–5.11)
RDW: 13.2 % (ref 11.5–15.5)
WBC: 4.2 10*3/uL (ref 4.0–10.5)

## 2018-06-26 LAB — COMPREHENSIVE METABOLIC PANEL
ALBUMIN: 4.3 g/dL (ref 3.5–5.2)
ALT: 13 U/L (ref 0–35)
AST: 16 U/L (ref 0–37)
Alkaline Phosphatase: 24 U/L — ABNORMAL LOW (ref 39–117)
BILIRUBIN TOTAL: 0.6 mg/dL (ref 0.2–1.2)
BUN: 9 mg/dL (ref 6–23)
CALCIUM: 9.5 mg/dL (ref 8.4–10.5)
CO2: 28 mEq/L (ref 19–32)
CREATININE: 0.78 mg/dL (ref 0.40–1.20)
Chloride: 103 mEq/L (ref 96–112)
GFR: 79.07 mL/min (ref >60.00)
Glucose, Bld: 79 mg/dL (ref 70–99)
Potassium: 3.9 mEq/L (ref 3.5–5.1)
SODIUM: 136 meq/L (ref 135–145)
Total Protein: 6.8 g/dL (ref 6.0–8.3)

## 2018-06-26 LAB — LIPID PANEL
CHOLESTEROL: 214 mg/dL — AB (ref 0–200)
HDL: 50 mg/dL (ref >39.00)
LDL Cholesterol: 148 mg/dL — ABNORMAL HIGH (ref 0–99)
NonHDL: 164.25
TRIGLYCERIDES: 83 mg/dL (ref 0.0–149.0)
Total CHOL/HDL Ratio: 4
VLDL: 16.6 mg/dL (ref 0.0–40.0)

## 2018-06-26 LAB — TSH: TSH: 1.11 u[IU]/mL (ref 0.35–4.50)

## 2018-06-26 LAB — VITAMIN D 25 HYDROXY (VIT D DEFICIENCY, FRACTURES): VITD: 30.13 ng/mL (ref 30.00–100.00)

## 2018-06-26 MED ORDER — CARISOPRODOL 350 MG PO TABS: 350.0000 mg | ORAL_TABLET | Freq: Two times a day (BID) | ORAL | 1 refills | Status: DC | PRN

## 2018-06-26 MED FILL — CARISOPRODOL 350 MG TABS: 350 | 20 days supply | Qty: 40 | Fill #0

## 2018-06-26 NOTE — Assessment & Plan Note (Signed)
Increase leafy greens, consider increased lean red meat and using cast iron cookware. Continue to monitor, report any concerns 
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
## 2018-06-26 NOTE — Assessment & Plan Note (Addendum)
Patient encouraged to maintain heart healthy diet, regular exercise, adequate sleep. Consider daily probiotics. Take medications as prescribed. Colonoscopy in 2013 with normal results, recommended f/u in 8 years, due 2021

## 2018-06-26 NOTE — Assessment & Plan Note (Signed)
Encouraged moist heat and gentle stretching as tolerated. May try NSAIDs and prescription meds as directed and report if symptoms worsen or seek immediate care. Is going to try massage etc and will let us know if persists or worsens. Hurts with arising from sitting laterally.

## 2018-06-26 NOTE — Assessment & Plan Note (Signed)
Supplement and monitor 

## 2018-06-26 NOTE — Assessment & Plan Note (Signed)
Encouraged heart healthy diet, increase exercise, avoid trans fats, consider a krill oil cap daily 

## 2018-06-26 NOTE — Assessment & Plan Note (Signed)
Left lower leg for a couple months referred to dermatology

## 2018-06-26 NOTE — Assessment & Plan Note (Addendum)
Encouraged good sleep hygiene such as dark, quiet room. No blue/green glowing lights such as computer screens in bedroom. No alcohol or stimulants in evening. Cut down on caffeine as able. Regular exercise is helpful but not just prior to bed time. Has tried some herbal preparations with and without melatonin and other ingredients. On a good night gets 6 hours of sleep and on a bad night 4 hours  Will try a [facility] referral for sleep

## 2018-06-26 NOTE — Patient Instructions (Signed)
Advanced Directives in chart  Shingrix is the new shingles shot, 2 shots over 2-6 months   Preventive Care 40-64 Years, Female Preventive care refers to lifestyle choices and visits with your health care provider that can promote health and wellness. What does preventive care include?   A yearly physical exam. This is also called an annual well check.  Dental exams once or twice a year.  Routine eye exams. Ask your health care provider how often you should have your eyes checked.  Personal lifestyle choices, including: ? Daily care of your teeth and gums. ? Regular physical activity. ? Eating a healthy diet. ? Avoiding tobacco and drug use. ? Limiting alcohol use. ? Practicing safe sex. ? Taking low-dose aspirin daily starting at age 41. ? Taking vitamin and mineral supplements as recommended by your health care provider. What happens during an annual well check? The services and screenings done by your health care provider during your annual well check will depend on your age, overall health, lifestyle risk factors, and family history of disease. Counseling Your health care provider may ask you questions about your:  Alcohol use.  Tobacco use.  Drug use.  Emotional well-being.  Home and relationship well-being.  Sexual activity.  Eating habits.  Work and work Statistician.  Method of birth control.  Menstrual cycle.  Pregnancy history. Screening You may have the following tests or measurements:  Height, weight, and BMI.  Blood pressure.  Lipid and cholesterol levels. These may be checked every 5 years, or more frequently if you are over 42 years old.  Skin check.  Lung cancer screening. You may have this screening every year starting at age 49 if you have a 30-pack-year history of smoking and currently smoke or have quit within the past 15 years.  Colorectal cancer screening. All adults should have this screening starting at age 65 and continuing until  age 24. Your health care provider may recommend screening at age 8. You will have tests every 1-10 years, depending on your results and the type of screening test. People at increased risk should start screening at an earlier age. Screening tests may include: ? Guaiac-based fecal occult blood testing. ? Fecal immunochemical test (FIT). ? Stool DNA test. ? Virtual colonoscopy. ? Sigmoidoscopy. During this test, a flexible tube with a tiny camera (sigmoidoscope) is used to examine your rectum and lower colon. The sigmoidoscope is inserted through your anus into your rectum and lower colon. ? Colonoscopy. During this test, a long, thin, flexible tube with a tiny camera (colonoscope) is used to examine your entire colon and rectum.  Hepatitis C blood test.  Hepatitis B blood test.  Sexually transmitted disease (STD) testing.  Diabetes screening. This is done by checking your blood sugar (glucose) after you have not eaten for a while (fasting). You may have this done every 1-3 years.  Mammogram. This may be done every 1-2 years. Talk to your health care provider about when you should start having regular mammograms. This may depend on whether you have a family history of breast cancer.  BRCA-related cancer screening. This may be done if you have a family history of breast, ovarian, tubal, or peritoneal cancers.  Pelvic exam and Pap test. This may be done every 3 years starting at age 5. Starting at age 70, this may be done every 5 years if you have a Pap test in combination with an HPV test.  Bone density scan. This is done to screen for osteoporosis. You  may have this scan if you are at high risk for osteoporosis. Discuss your test results, treatment options, and if necessary, the need for more tests with your health care provider. Vaccines Your health care provider may recommend certain vaccines, such as:  Influenza vaccine. This is recommended every year.  Tetanus, diphtheria, and  acellular pertussis (Tdap, Td) vaccine. You may need a Td booster every 10 years.  Varicella vaccine. You may need this if you have not been vaccinated.  Zoster vaccine. You may need this after age 56.  Measles, mumps, and rubella (MMR) vaccine. You may need at least one dose of MMR if you were born in 1957 or later. You may also need a second dose.  Pneumococcal 13-valent conjugate (PCV13) vaccine. You may need this if you have certain conditions and were not previously vaccinated.  Pneumococcal polysaccharide (PPSV23) vaccine. You may need one or two doses if you smoke cigarettes or if you have certain conditions.  Meningococcal vaccine. You may need this if you have certain conditions.  Hepatitis A vaccine. You may need this if you have certain conditions or if you travel or work in places where you may be exposed to hepatitis A.  Hepatitis B vaccine. You may need this if you have certain conditions or if you travel or work in places where you may be exposed to hepatitis B.  Haemophilus influenzae type b (Hib) vaccine. You may need this if you have certain conditions. Talk to your health care provider about which screenings and vaccines you need and how often you need them. This information is not intended to replace advice given to you by your health care provider. Make sure you discuss any questions you have with your health care provider. Document Released: 06/27/2015 Document Revised: 07/21/2017 Document Reviewed: 04/01/2015 Elsevier Interactive Patient Education  2019 [employer].

## 2018-06-26 NOTE — Assessment & Plan Note (Addendum)
Has captured PACs on EKG when she has checked. She drinks 2 cups of coffee and a couple diet cokes. Will try and cut back. Will try to get better sleep. Consider cardiac consultation if persists or worsens. Episodes last seconds but are occurring more often. She does not have any associated symptoms

## 2018-07-18 DIAGNOSIS — F5101 Primary insomnia: Secondary | ICD-10-CM | POA: Diagnosis not present

## 2018-07-18 DIAGNOSIS — N951 Menopausal and female climacteric states: Secondary | ICD-10-CM | POA: Diagnosis not present

## 2018-07-18 DIAGNOSIS — Z01419 Encounter for gynecological examination (general) (routine) without abnormal findings: Secondary | ICD-10-CM | POA: Diagnosis not present

## 2018-07-18 DIAGNOSIS — Z79890 Hormone replacement therapy (postmenopausal): Secondary | ICD-10-CM | POA: Diagnosis not present

## 2018-07-18 MED FILL — ZOLPIDEM TARTRATE 10 MG TAB: 10 | 20 days supply | Qty: 20 | Fill #0

## 2018-07-18 MED FILL — DOTTI 0.05 MG/24HR PTTW: 0.05 | 84 days supply | Qty: 24 | Fill #0 | Status: TO

## 2018-08-16 MED FILL — ZOLPIDEM TARTRATE 10 MG TAB: 10 | 20 days supply | Qty: 20 | Fill #1 | Status: TO

## 2018-08-16 MED FILL — MEDROXYPROGESTERONE 5 MG TA: 5 | 90 days supply | Qty: 90 | Fill #0

## 2018-08-22 MED FILL — CARISOPRODOL 350 MG TABS: 350 | 20 days supply | Qty: 40 | Fill #1

## 2018-09-30 MED FILL — ZOLPIDEM TARTRATE 10 MG TAB: 10 | 20 days supply | Qty: 20 | Fill #0

## 2018-09-30 MED FILL — DOTTI 0.05 MG/24HR PTTW: 0.05 | 84 days supply | Qty: 24 | Fill #0

## 2018-10-05 ENCOUNTER — Encounter: Payer: Self-pay | Admitting: Family Medicine

## 2018-10-06 ENCOUNTER — Other Ambulatory Visit: Payer: Self-pay | Admitting: Family Medicine

## 2018-10-06 MED ORDER — CARISOPRODOL 350 MG PO TABS: 350.0000 mg | ORAL_TABLET | Freq: Two times a day (BID) | ORAL | 1 refills | Status: DC | PRN

## 2018-10-06 MED FILL — CARISOPRODOL 350 MG TABS: 350 | 20 days supply | Qty: 40 | Fill #0

## 2018-10-06 NOTE — Telephone Encounter (Signed)
Patient will call to have visit  Can you send in Soma Please advise

## 2018-11-09 MED FILL — ZOLPIDEM TARTRATE 10 MG TAB: 10 | 20 days supply | Qty: 20 | Fill #1

## 2018-11-23 MED FILL — MEDROXYPROGESTERONE 5 MG TA: 5 | 90 days supply | Qty: 90 | Fill #0

## 2018-12-06 MED FILL — DOTTI 0.05 MG/24HR PTTW: 0.05 | 84 days supply | Qty: 24 | Fill #1

## 2018-12-20 MED FILL — ZOLPIDEM TARTRATE 10 MG TAB: 10 | 20 days supply | Qty: 20 | Fill #0

## 2018-12-21 MED FILL — CARISOPRODOL 350 MG TABS: 350 | 20 days supply | Qty: 40 | Fill #1

## 2019-01-17 MED FILL — MELOXICAM 15 MG TABLET: 15 | 30 days supply | Qty: 30 | Fill #1

## 2019-02-06 MED FILL — ZOLPIDEM TARTRATE 10 MG TAB: 10 | 20 days supply | Qty: 20 | Fill #1

## 2019-03-05 MED FILL — ZOLPIDEM TARTRATE 10 MG TAB: 10 | 20 days supply | Qty: 20 | Fill #0

## 2019-03-08 MED FILL — MEDROXYPROGESTERONE 5 MG TA: 5 | 90 days supply | Qty: 90 | Fill #1

## 2019-03-08 MED FILL — DOTTI 0.05 MG/24HR PTTW: 0.05 | 84 days supply | Qty: 24 | Fill #2

## 2019-04-12 MED FILL — ZOLPIDEM TARTRATE 10 MG TAB: 10 | 20 days supply | Qty: 20 | Fill #1

## 2019-05-01 MED FILL — ZOLPIDEM TARTRATE 10 MG TAB: 10 | 20 days supply | Qty: 20 | Fill #0

## 2019-05-28 MED FILL — DOTTI 0.05 MG/24HR PTTW: 0.05 | 84 days supply | Qty: 24 | Fill #3

## 2019-05-28 MED FILL — MEDROXYPROGESTERONE 5 MG TA: 5 | 90 days supply | Qty: 90 | Fill #2

## 2019-06-18 MED FILL — ZOLPIDEM TARTRATE 10 MG TAB: 10 | 20 days supply | Qty: 20 | Fill #1

## 2019-07-06 ENCOUNTER — Encounter: Payer: Self-pay | Admitting: Family Medicine

## 2019-07-24 ENCOUNTER — Ambulatory Visit (INDEPENDENT_AMBULATORY_CARE_PROVIDER_SITE_OTHER): Payer: 59 | Admitting: Family Medicine

## 2019-07-24 ENCOUNTER — Other Ambulatory Visit (HOSPITAL_COMMUNITY): Payer: Self-pay | Admitting: Obstetrics and Gynecology

## 2019-07-24 ENCOUNTER — Other Ambulatory Visit: Payer: Self-pay

## 2019-07-24 ENCOUNTER — Encounter: Payer: Self-pay | Admitting: Family Medicine

## 2019-07-24 VITALS — BP 132/66 | HR 67 | Temp 97.3°F | Ht 64.0 in | Wt 142.0 lb

## 2019-07-24 DIAGNOSIS — D649 Anemia, unspecified: Secondary | ICD-10-CM

## 2019-07-24 DIAGNOSIS — E785 Hyperlipidemia, unspecified: Secondary | ICD-10-CM

## 2019-07-24 DIAGNOSIS — E559 Vitamin D deficiency, unspecified: Secondary | ICD-10-CM

## 2019-07-24 DIAGNOSIS — R002 Palpitations: Secondary | ICD-10-CM

## 2019-07-24 DIAGNOSIS — D696 Thrombocytopenia, unspecified: Secondary | ICD-10-CM | POA: Diagnosis not present

## 2019-07-24 DIAGNOSIS — G4700 Insomnia, unspecified: Secondary | ICD-10-CM

## 2019-07-24 MED FILL — ZOLPIDEM TARTRATE 10 MG TAB: 10 | 20 days supply | Qty: 20 | Fill #0

## 2019-07-24 NOTE — Assessment & Plan Note (Signed)
Supplement and monitor 

## 2019-07-24 NOTE — Patient Instructions (Addendum)
Omron Blood Pressure cuff, upper arm, want BP 100-140/60-90 Pulse oximeter, want oxygen in 90s  Weekly vitals  Take Multivitamin with minerals, selenium Vitamin D 1000-2000 IU daily Probiotic with lactobacillus and bifidophilus Asprin EC 81 mg daily  Melatonin 2-5 mg at bedtime  [email or web address]  Palpitations Palpitations are feelings that your heartbeat is irregular or is faster than normal. It may feel like your heart is fluttering or skipping a beat. Palpitations are usually not a serious problem. They may be caused by many things, including smoking, caffeine, alcohol, stress, and certain medicines or drugs. Most causes of palpitations are not serious. However, some palpitations can be a sign of a serious problem. You may need further tests to rule out serious medical problems. Follow these instructions at home:     Pay attention to any changes in your condition. Take these actions to help manage your symptoms: Eating and drinking  Avoid foods and drinks that may cause palpitations. These may include: ? Caffeinated coffee, tea, soft drinks, diet pills, and energy drinks. ? Chocolate. ? Alcohol. Lifestyle  Take steps to reduce your stress and anxiety. Things that can help you relax include: ? Yoga. ? Mind-body activities, such as deep breathing, meditation, or using words and images to create positive thoughts (guided imagery). ? Physical activity, such as swimming, jogging, or walking. Tell your health care provider if your palpitations increase with activity. If you have chest pain or shortness of breath with activity, do not continue the activity until you are seen by your health care provider. ? Biofeedback. This is a method that helps you learn to use your mind to control things in your body, such as your heartbeat.  Do not use drugs, including cocaine or ecstasy. Do not use marijuana.  Get plenty of rest and sleep. Keep a regular bed  time. General instructions  Take over-the-counter and prescription medicines only as told by your health care provider.  Do not use any products that contain nicotine or tobacco, such as cigarettes and e-cigarettes. If you need help quitting, ask your health care provider.  Keep all follow-up visits as told by your health care provider. This is important. These may include visits for further testing if palpitations do not go away or get worse. Contact a health care provider if you:  Continue to have a fast or irregular heartbeat after 24 hours.  Notice that your palpitations occur more often. Get help right away if you:  Have chest pain or shortness of breath.  Have a severe headache.  Feel dizzy or you faint. Summary  Palpitations are feelings that your heartbeat is irregular or is faster than normal. It may feel like your heart is fluttering or skipping a beat.  Palpitations may be caused by many things, including smoking, caffeine, alcohol, stress, certain medicines, and drugs.  Although most causes of palpitations are not serious, some causes can be a sign of a serious medical problem.  Get help right away if you faint or have chest pain, shortness of breath, a severe headache, or dizziness. This information is not intended to replace advice given to you by your health care provider. Make sure you discuss any questions you have with your health care provider. Document Revised: 07/13/2017 Document Reviewed: 07/13/2017 Elsevier Patient Education  [place].  Take Multivitamin with minerals, selenium Vitamin D 1000-2000 IU daily Probiotic with lactobacillus and bifidophilus Asprin EC 81 mg daily  Melatonin 2-5 mg at bedtime  [email or web address]  Magnesium Glycinate
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
## 2019-07-24 NOTE — Assessment & Plan Note (Signed)
Encouraged heart healthy diet, increase exercise, avoid trans fats, consider a krill oil cap daily 

## 2019-07-24 NOTE — Assessment & Plan Note (Signed)
Continues to struggle. Uses Ambien rarely with good results. Encouraged good sleep hygiene such as dark, quiet room. No blue/green glowing lights such as computer screens in bedroom. No alcohol or stimulants in evening. Cut down on caffeine as able. Regular exercise is helpful but not just prior to bed time. She is encouraged to try Melatonin, magnesium glycinate and myocalm when she is having trouble sleeping

## 2019-07-24 NOTE — Assessment & Plan Note (Signed)
Mild, asymptomatic, repeat CBC

## 2019-07-24 NOTE — Assessment & Plan Note (Signed)
Over the past one to two months she has experienced more episodes of palpitations sometimes lasting hours and is now ready for hours. No associated symptoms still, will proceed with labs including cmp and tsh to evaluate and she agrees to consultation with cardiology and will consider purchasing a Kartia mobile device to monitor her rhythms. She is minimizing caffeine and trying to eat well
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
## 2019-07-27 ENCOUNTER — Ambulatory Visit (INDEPENDENT_AMBULATORY_CARE_PROVIDER_SITE_OTHER): Payer: 59 | Admitting: Cardiology

## 2019-07-27 ENCOUNTER — Encounter: Payer: Self-pay | Admitting: Cardiology

## 2019-07-27 ENCOUNTER — Other Ambulatory Visit: Payer: Self-pay

## 2019-07-27 VITALS — BP 130/84 | HR 72 | Temp 97.1°F | Ht 64.0 in | Wt 145.0 lb

## 2019-07-27 DIAGNOSIS — R002 Palpitations: Secondary | ICD-10-CM

## 2019-07-27 NOTE — Patient Instructions (Signed)
KardiaMobile [email or web address]        FDA-cleared, clinical grade mobile EKG monitor: Jodelle Red is the most clinically-validated mobile EKG used by the world's leading cardiac care medical professionals With Basic service, know instantly if your heart rhythm is normal or if atrial fibrillation is detected, and email the last single EKG recording to yourself or your doctor Premium service, available for purchase through the Kardia app for $9.99 per month or $99 per year, includes unlimited history and storage of your EKG recordings, a monthly EKG summary report to share with your doctor, along with the ability to track your blood pressure, activity and weight Includes one KardiaMobile phone clip FREE SHIPPING: Standard delivery 1-3 business days. Orders placed by 11:00am PST will ship that afternoon. Otherwise, will ship next business day. All orders ship via [employer] from [place], [place]  1. Avoid all over-the-counter antihistamines except Claritin/Loratadine and Zyrtec/Cetrizine. 2. Avoid all combination including cold sinus allergies flu decongestant and sleep medications 3. You can use Robitussin DM Mucinex and Mucinex DM for cough. 4. can use Tylenol aspirin ibuprofen and naproxen but no combinations such as sleep or sinus.

## 2019-08-02 ENCOUNTER — Other Ambulatory Visit: Payer: 59

## 2019-08-08 ENCOUNTER — Other Ambulatory Visit: Payer: 59

## 2019-08-14 ENCOUNTER — Other Ambulatory Visit: Payer: Self-pay

## 2019-08-14 ENCOUNTER — Other Ambulatory Visit (INDEPENDENT_AMBULATORY_CARE_PROVIDER_SITE_OTHER): Payer: 59

## 2019-08-14 DIAGNOSIS — E559 Vitamin D deficiency, unspecified: Secondary | ICD-10-CM | POA: Diagnosis not present

## 2019-08-14 DIAGNOSIS — E785 Hyperlipidemia, unspecified: Secondary | ICD-10-CM | POA: Diagnosis not present

## 2019-08-14 DIAGNOSIS — R002 Palpitations: Secondary | ICD-10-CM | POA: Diagnosis not present

## 2019-08-14 DIAGNOSIS — D649 Anemia, unspecified: Secondary | ICD-10-CM | POA: Diagnosis not present

## 2019-08-14 LAB — COMPREHENSIVE METABOLIC PANEL
ALT: 13 U/L (ref 0–35)
AST: 18 U/L (ref 0–37)
Albumin: 4 g/dL (ref 3.5–5.2)
Alkaline Phosphatase: 26 U/L — ABNORMAL LOW (ref 39–117)
BUN: 8 mg/dL (ref 6–23)
CO2: 28 mEq/L (ref 19–32)
Calcium: 9.1 mg/dL (ref 8.4–10.5)
Chloride: 103 mEq/L (ref 96–112)
Creatinine, Ser: 0.78 mg/dL (ref 0.40–1.20)
GFR: 74.13 mL/min (ref >60.00)
Glucose, Bld: 93 mg/dL (ref 70–99)
Potassium: 3.8 mEq/L (ref 3.5–5.1)
Sodium: 136 mEq/L (ref 135–145)
Total Bilirubin: 0.7 mg/dL (ref 0.2–1.2)
Total Protein: 6.5 g/dL (ref 6.0–8.3)

## 2019-08-14 LAB — CBC
HCT: 36 % (ref 36.0–46.0)
Hemoglobin: 12.5 g/dL (ref 12.0–15.0)
MCHC: 34.6 g/dL (ref 30.0–36.0)
MCV: 96.7 fl (ref 78.0–100.0)
Platelets: 133 10*3/uL — ABNORMAL LOW (ref 150.0–400.0)
RBC: 3.73 Mil/uL — ABNORMAL LOW (ref 3.87–5.11)
RDW: 13 % (ref 11.5–15.5)
WBC: 3.7 10*3/uL — ABNORMAL LOW (ref 4.0–10.5)

## 2019-08-14 LAB — LIPID PANEL
Cholesterol: 202 mg/dL — ABNORMAL HIGH (ref 0–200)
HDL: 50.1 mg/dL (ref >39.00)
LDL Cholesterol: 136 mg/dL — ABNORMAL HIGH (ref 0–99)
NonHDL: 151.48
Total CHOL/HDL Ratio: 4
Triglycerides: 78 mg/dL (ref 0.0–149.0)
VLDL: 15.6 mg/dL (ref 0.0–40.0)

## 2019-08-14 LAB — TSH: TSH: 1.76 u[IU]/mL (ref 0.35–4.50)

## 2019-08-14 LAB — VITAMIN D 25 HYDROXY (VIT D DEFICIENCY, FRACTURES): VITD: 32.99 ng/mL (ref 30.00–100.00)

## 2019-08-14 LAB — MAGNESIUM: Magnesium: 2.1 mg/dL (ref 1.5–2.5)

## 2019-08-17 MED FILL — DOTTI 0.05 MG/24HR PTTW: 0.05 | 84 days supply | Qty: 24 | Fill #0

## 2019-08-17 MED FILL — MEDROXYPROGESTERONE 5 MG TA: 5 | 90 days supply | Qty: 90 | Fill #0

## 2019-08-28 ENCOUNTER — Other Ambulatory Visit (HOSPITAL_BASED_OUTPATIENT_CLINIC_OR_DEPARTMENT_OTHER): Payer: Self-pay | Admitting: Family Medicine

## 2019-08-28 DIAGNOSIS — Z1231 Encounter for screening mammogram for malignant neoplasm of breast: Secondary | ICD-10-CM

## 2019-08-31 ENCOUNTER — Other Ambulatory Visit: Payer: Self-pay

## 2019-08-31 ENCOUNTER — Ambulatory Visit (HOSPITAL_BASED_OUTPATIENT_CLINIC_OR_DEPARTMENT_OTHER)
Admission: RE | Admit: 2019-08-31 | Discharge: 2019-08-31 | Disposition: A | Discharge status: Home / Self Care | Payer: 59 | Source: Ambulatory Visit | Attending: Family Medicine | Admitting: Family Medicine

## 2019-08-31 DIAGNOSIS — Z1231 Encounter for screening mammogram for malignant neoplasm of breast: Secondary | ICD-10-CM | POA: Diagnosis not present

## 2019-09-13 MED FILL — ZOLPIDEM TARTRATE 10 MG TAB: 10 | 20 days supply | Qty: 20 | Fill #1

## 2019-10-16 MED FILL — ZOLPIDEM TARTRATE 10 MG TAB: 10 | 20 days supply | Qty: 20 | Fill #0

## 2019-11-07 MED FILL — DOTTI 0.05 MG/24HR PTTW: 0.05 | 84 days supply | Qty: 24 | Fill #1

## 2019-11-07 MED FILL — MEDROXYPROGESTERONE 5 MG TA: 5 | 90 days supply | Qty: 90 | Fill #1

## 2019-12-05 MED FILL — ZOLPIDEM TARTRATE 10 MG TAB: 10 | 20 days supply | Qty: 20 | Fill #1

## 2020-01-21 MED FILL — ZOLPIDEM TARTRATE 10 MG TAB: 10 | 20 days supply | Qty: 20 | Fill #0

## 2020-01-29 ENCOUNTER — Ambulatory Visit: Payer: 59 | Admitting: Family Medicine

## 2020-02-12 MED FILL — DOTTI 0.05 MG/24HR PTTW: 0.05 | 84 days supply | Qty: 24 | Fill #2

## 2020-02-12 MED FILL — MEDROXYPROGESTERONE 5 MG TA: 5 | 90 days supply | Qty: 90 | Fill #2

## 2020-03-05 MED FILL — ZOLPIDEM TARTRATE 10 MG TAB: 10 | 20 days supply | Qty: 20 | Fill #1

## 2020-04-22 ENCOUNTER — Other Ambulatory Visit (HOSPITAL_COMMUNITY): Payer: Self-pay | Admitting: Obstetrics and Gynecology

## 2020-04-22 MED FILL — ZOLPIDEM TARTRATE 10 MG TAB: 10 | 20 days supply | Qty: 20 | Fill #0

## 2020-05-07 MED FILL — DOTTI 0.05 MG/24HR PTTW: 0.05 | 84 days supply | Qty: 24 | Fill #3

## 2020-05-27 ENCOUNTER — Encounter: Payer: Self-pay | Admitting: Family Medicine

## 2020-05-27 ENCOUNTER — Other Ambulatory Visit: Payer: Self-pay | Admitting: Family Medicine

## 2020-05-27 MED ORDER — CARISOPRODOL 350 MG PO TABS: 350.0000 mg | ORAL_TABLET | Freq: Two times a day (BID) | ORAL | 1 refills | Status: DC | PRN

## 2020-05-27 MED ORDER — MELOXICAM 15 MG PO TABS: 15.0000 mg | ORAL_TABLET | Freq: Every day | ORAL | 1 refills | Status: DC | PRN

## 2020-05-27 MED FILL — MELOXICAM 15 MG TABLET: 15 | 30 days supply | Qty: 30 | Fill #0

## 2020-05-27 MED FILL — CARISOPRODOL 350 MG TABS: 350 | 20 days supply | Qty: 40 | Fill #0

## 2020-05-30 ENCOUNTER — Ambulatory Visit (INDEPENDENT_AMBULATORY_CARE_PROVIDER_SITE_OTHER): Payer: 59 | Admitting: Family Medicine

## 2020-05-30 ENCOUNTER — Other Ambulatory Visit: Payer: Self-pay

## 2020-05-30 ENCOUNTER — Encounter: Payer: Self-pay | Admitting: Family Medicine

## 2020-05-30 DIAGNOSIS — M25551 Pain in right hip: Secondary | ICD-10-CM | POA: Diagnosis not present
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
## 2020-06-10 MED FILL — ZOLPIDEM TARTRATE 10 MG TAB: 10 | 20 days supply | Qty: 20 | Fill #1

## 2020-06-19 MED FILL — CARISOPRODOL 350 MG TABS: 350 | 20 days supply | Qty: 40 | Fill #1

## 2020-07-21 ENCOUNTER — Other Ambulatory Visit: Payer: Self-pay

## 2020-07-21 ENCOUNTER — Ambulatory Visit (INDEPENDENT_AMBULATORY_CARE_PROVIDER_SITE_OTHER): Payer: 59 | Admitting: Family Medicine

## 2020-07-21 ENCOUNTER — Other Ambulatory Visit: Payer: Self-pay | Admitting: Family Medicine

## 2020-07-21 VITALS — BP 130/80 | HR 83 | Temp 98.2°F | Resp 16 | Ht 64.0 in | Wt 143.6 lb

## 2020-07-21 DIAGNOSIS — D649 Anemia, unspecified: Secondary | ICD-10-CM

## 2020-07-21 DIAGNOSIS — E559 Vitamin D deficiency, unspecified: Secondary | ICD-10-CM | POA: Diagnosis not present

## 2020-07-21 DIAGNOSIS — M25551 Pain in right hip: Secondary | ICD-10-CM | POA: Diagnosis not present

## 2020-07-21 DIAGNOSIS — G4700 Insomnia, unspecified: Secondary | ICD-10-CM | POA: Diagnosis not present

## 2020-07-21 DIAGNOSIS — E785 Hyperlipidemia, unspecified: Secondary | ICD-10-CM | POA: Diagnosis not present

## 2020-07-21 DIAGNOSIS — Z0000 Encounter for general adult medical examination without abnormal findings: Secondary | ICD-10-CM

## 2020-07-21 LAB — CBC WITH DIFFERENTIAL/PLATELET
Basophils Absolute: 0 10*3/uL (ref 0.0–0.1)
Basophils Relative: 1.1 % (ref 0.0–3.0)
Eosinophils Absolute: 0 10*3/uL (ref 0.0–0.7)
Eosinophils Relative: 0.9 % (ref 0.0–5.0)
HCT: 37.7 % (ref 36.0–46.0)
Hemoglobin: 12.9 g/dL (ref 12.0–15.0)
Lymphocytes Relative: 25.3 % (ref 12.0–46.0)
Lymphs Abs: 1 10*3/uL (ref 0.7–4.0)
MCHC: 34.2 g/dL (ref 30.0–36.0)
MCV: 95.3 fl (ref 78.0–100.0)
Monocytes Absolute: 0.3 10*3/uL (ref 0.1–1.0)
Monocytes Relative: 8.1 % (ref 3.0–12.0)
Neutro Abs: 2.5 10*3/uL (ref 1.4–7.7)
Neutrophils Relative %: 64.6 % (ref 43.0–77.0)
Platelets: 140 10*3/uL — ABNORMAL LOW (ref 150.0–400.0)
RBC: 3.96 Mil/uL (ref 3.87–5.11)
RDW: 14 % (ref 11.5–15.5)
WBC: 3.8 10*3/uL — ABNORMAL LOW (ref 4.0–10.5)

## 2020-07-21 LAB — LIPID PANEL
Cholesterol: 223 mg/dL — ABNORMAL HIGH (ref 0–200)
HDL: 62.8 mg/dL (ref >39.00)
LDL Cholesterol: 147 mg/dL — ABNORMAL HIGH (ref 0–99)
NonHDL: 159.86
Total CHOL/HDL Ratio: 4
Triglycerides: 65 mg/dL (ref 0.0–149.0)
VLDL: 13 mg/dL (ref 0.0–40.0)

## 2020-07-21 LAB — TSH: TSH: 2.18 u[IU]/mL (ref 0.35–4.50)

## 2020-07-21 LAB — COMPREHENSIVE METABOLIC PANEL
ALT: 21 U/L (ref 0–35)
AST: 22 U/L (ref 0–37)
Albumin: 4.5 g/dL (ref 3.5–5.2)
Alkaline Phosphatase: 30 U/L — ABNORMAL LOW (ref 39–117)
BUN: 8 mg/dL (ref 6–23)
CO2: 28 mEq/L (ref 19–32)
Calcium: 9.5 mg/dL (ref 8.4–10.5)
Chloride: 102 mEq/L (ref 96–112)
Creatinine, Ser: 0.74 mg/dL (ref 0.40–1.20)
GFR: 84.62 mL/min (ref >60.00)
Glucose, Bld: 88 mg/dL (ref 70–99)
Potassium: 3.9 mEq/L (ref 3.5–5.1)
Sodium: 135 mEq/L (ref 135–145)
Total Bilirubin: 0.7 mg/dL (ref 0.2–1.2)
Total Protein: 6.8 g/dL (ref 6.0–8.3)

## 2020-07-21 LAB — MAGNESIUM: Magnesium: 2.2 mg/dL (ref 1.5–2.5)

## 2020-07-21 MED ORDER — ZOLPIDEM TARTRATE 10 MG PO TABS: 10.0000 mg | ORAL_TABLET | Freq: Every evening | ORAL | 2 refills | Status: DC | PRN

## 2020-07-21 MED ORDER — CARISOPRODOL 350 MG PO TABS: 350.0000 mg | ORAL_TABLET | Freq: Two times a day (BID) | ORAL | 2 refills | Status: DC | PRN

## 2020-07-21 MED FILL — CARISOPRODOL 350 MG TABS: 350 | 20 days supply | Qty: 40 | Fill #0

## 2020-07-21 MED FILL — MEDROXYPROGESTERONE 5 MG TA: 5 | 90 days supply | Qty: 90 | Fill #3

## 2020-07-21 MED FILL — ZOLPIDEM TARTRATE 10 MG TAB: 10 | 30 days supply | Qty: 30 | Fill #0

## 2020-07-21 NOTE — Assessment & Plan Note (Signed)
Supplement and monitor 

## 2020-07-21 NOTE — Assessment & Plan Note (Addendum)
Patient encouraged to maintain heart healthy diet, regular exercise, adequate sleep. Consider daily probiotics. Take medications as prescribed. MGM was March 2021 due in next year. Dexa scam 2019 was normal repeat at the 5 year. Colonoscopy

## 2020-07-21 NOTE — Patient Instructions (Addendum)
[place], Sleep combo [facility]+  Dermatology appointment Pneumovax (PCV 23) now Shingrix is the new shingles shot 2 shots 2-6 months apart call for nurse appt  Recommend calcium intake of 1200 to 1500 mg daily, divided into roughly 3 doses. Best source is the diet and a single dairy serving is about 500 mg, a supplement of calcium citrate once or twice daily to balance diet is fine if not getting enough in diet. Also need Vitamin D 2000 IU caps, 1 cap daily if not already taking vitamin D. Also recommend weight baring exercise on hips and upper body to keep bones strong Preventive Care 66 Years and Older, Female Preventive care refers to lifestyle choices and visits with your health care provider that can promote health and wellness. This includes:  A yearly physical exam. This is also called an annual wellness visit.  Regular dental and eye exams.  Immunizations.  Screening for certain conditions.  Healthy lifestyle choices, such as: ? Eating a healthy diet. ? Getting regular exercise. ? Not using drugs or products that contain nicotine and tobacco. ? Limiting alcohol use. What can I expect for my preventive care visit? Physical exam Your health care provider will check your:  Height and weight. These may be used to calculate your BMI (body mass index). BMI is a measurement that tells if you are at a healthy weight.  Heart rate and blood pressure.  Body temperature.  Skin for abnormal spots. Counseling Your health care provider may ask you questions about your:  Past medical problems.  Family's medical history.  Alcohol, tobacco, and drug use.  Emotional well-being.  Home life and relationship well-being.  Sexual activity.  Diet, exercise, and sleep habits.  History of falls.  Memory and ability to understand (cognition).  Work and work Statistician.  Pregnancy and menstrual history.  Access to firearms. What immunizations do I need? Vaccines  are usually given at various ages, according to a schedule. Your health care provider will recommend vaccines for you based on your age, medical history, and lifestyle or other factors, such as travel or where you work.   What tests do I need? Blood tests  Lipid and cholesterol levels. These may be checked every 5 years, or more often depending on your overall health.  Hepatitis C test.  Hepatitis B test. Screening  Lung cancer screening. You may have this screening every year starting at age 66 if you have a 30-pack-year history of smoking and currently smoke or have quit within the past 15 years.  Colorectal cancer screening. ? All adults should have this screening starting at age 66 and continuing until age 79. ? Your health care provider may recommend screening at age 66 if you are at increased risk. ? You will have tests every 1-10 years, depending on your results and the type of screening test.  Diabetes screening. ? This is done by checking your blood sugar (glucose) after you have not eaten for a while (fasting). ? You may have this done every 1-3 years.  Mammogram. ? This may be done every 1-2 years. ? Talk with your health care provider about how often you should have regular mammograms.  Abdominal aortic aneurysm (AAA) screening. You may need this if you are a current or former smoker.  BRCA-related cancer screening. This may be done if you have a family history of breast, ovarian, tubal, or peritoneal cancers. Other tests  STD (sexually transmitted disease) testing, if you are at risk.  Bone  density scan. This is done to screen for osteoporosis. You may have this done starting at age 66. Talk with your health care provider about your test results, treatment options, and if necessary, the need for more tests. Follow these instructions at home: Eating and drinking  Eat a diet that includes fresh fruits and vegetables, whole grains, lean protein, and low-fat dairy  products. Limit your intake of foods with high amounts of sugar, saturated fats, and salt.  Take vitamin and mineral supplements as recommended by your health care provider.  Do not drink alcohol if your health care provider tells you not to drink.  If you drink alcohol: ? Limit how much you have to 0-1 drink a day. ? Be aware of how much alcohol is in your drink. In the [place] one drink equals one 12 oz bottle of beer (355 mL), one 5 oz glass of wine (148 mL), or one 1 oz glass of hard liquor (44 mL).   Lifestyle  Take daily care of your teeth and gums. Brush your teeth every morning and night with fluoride toothpaste. Floss one time each day.  Stay active. Exercise for at least 30 minutes 5 or more days each week.  Do not use any products that contain nicotine or tobacco, such as cigarettes, e-cigarettes, and chewing tobacco. If you need help quitting, ask your health care provider.  Do not use drugs.  If you are sexually active, practice safe sex. Use a condom or other form of protection in order to prevent STIs (sexually transmitted infections).  Talk with your health care provider about taking a low-dose aspirin or statin.  Find healthy ways to cope with stress, such as: ? Meditation, yoga, or listening to music. ? Journaling. ? Talking to a trusted person. ? Spending time with friends and family. Safety  Always wear your seat belt while driving or riding in a vehicle.  Do not drive: ? If you have been drinking alcohol. Do not ride with someone who has been drinking. ? When you are tired or distracted. ? While texting.  Wear a helmet and other protective equipment during sports activities.  If you have firearms in your house, make sure you follow all gun safety procedures. What's next?  Visit your health care provider once a year for an annual wellness visit.  Ask your health care provider how often you should have your eyes and teeth checked.  Stay up to date on  all vaccines. This information is not intended to replace advice given to you by your health care provider. Make sure you discuss any questions you have with your health care provider. Document Revised: 05/21/2020 Document Reviewed: 05/25/2018 Elsevier Patient Education  2021 [employer].

## 2020-07-21 NOTE — Assessment & Plan Note (Signed)
Encouraged good sleep hygiene such as dark, quiet room. No blue/green glowing lights such as computer screens in bedroom. No alcohol or stimulants in evening. Cut down on caffeine as able. Regular exercise is helpful but not just prior to bed time. Refill given Ambien and can se Magnesium Glycinate and other OTC supplements.

## 2020-07-21 NOTE — Assessment & Plan Note (Signed)
Has been having trouble off and on for months. She went to see [facility] and received an injection which helped the hip but now she is having increased discomfort just above the hip. It is disrupting her sleep further and she is ready to have it looked at again. Massage therapy helps some. Referred to sports medicine for further consideration and treatment. Encouraged ongoing stretching and possibly TaiChi vs Yoga
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
## 2020-07-21 NOTE — Assessment & Plan Note (Signed)
Encouraged heart healthy diet, increase exercise, avoid trans fats, consider a krill oil cap daily 

## 2020-07-24 ENCOUNTER — Other Ambulatory Visit (HOSPITAL_COMMUNITY): Payer: Self-pay | Admitting: Obstetrics and Gynecology

## 2020-07-24 MED FILL — DOTTI 0.05 MG/24HR PTTW: 0.05 | 84 days supply | Qty: 24 | Fill #0

## 2020-07-25 LAB — VITAMIN D 1,25 DIHYDROXY
Vitamin D 1, 25 (OH)2 Total: 42 pg/mL (ref 18–72)
Vitamin D2 1, 25 (OH)2: <8 pg/mL
Vitamin D3 1, 25 (OH)2: 42 pg/mL

## 2020-08-15 MED FILL — CARISOPRODOL 350 MG TABS: 350 | 20 days supply | Qty: 40 | Fill #1
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
## 2020-08-18 ENCOUNTER — Other Ambulatory Visit: Payer: Self-pay

## 2020-08-18 ENCOUNTER — Ambulatory Visit: Payer: 59 | Admitting: Family Medicine

## 2020-08-18 ENCOUNTER — Ambulatory Visit (INDEPENDENT_AMBULATORY_CARE_PROVIDER_SITE_OTHER): Payer: 59

## 2020-08-18 ENCOUNTER — Encounter: Payer: Self-pay | Admitting: Family Medicine

## 2020-08-18 VITALS — BP 120/74 | HR 67 | Ht 64.0 in | Wt 145.0 lb

## 2020-08-18 DIAGNOSIS — M4186 Other forms of scoliosis, lumbar region: Secondary | ICD-10-CM | POA: Diagnosis not present

## 2020-08-18 DIAGNOSIS — M25512 Pain in left shoulder: Secondary | ICD-10-CM | POA: Diagnosis not present

## 2020-08-18 DIAGNOSIS — M5136 Other intervertebral disc degeneration, lumbar region: Secondary | ICD-10-CM | POA: Diagnosis not present

## 2020-08-18 DIAGNOSIS — M545 Low back pain, unspecified: Secondary | ICD-10-CM

## 2020-08-18 DIAGNOSIS — G8929 Other chronic pain: Secondary | ICD-10-CM

## 2020-08-18 DIAGNOSIS — G5701 Lesion of sciatic nerve, right lower limb: Secondary | ICD-10-CM | POA: Diagnosis not present

## 2020-08-18 DIAGNOSIS — M19012 Primary osteoarthritis, left shoulder: Secondary | ICD-10-CM | POA: Diagnosis not present

## 2020-08-18 NOTE — Assessment & Plan Note (Signed)
Patient does have more of the left shoulder pain.  Discussed posture and ergonomics.  We discussed that there is a possibility of a rotator cuff arthropathy with a limited range of motion and x-rays are pending.  Differential does include frozen shoulder.  Patient would like to start with home exercises, we discussed keeping hands within peripheral vision.  We discussed avoiding certain lifting mechanics.  Worsening pain can consider injections and formal physical therapy.  Follow-up again in 6 weeks

## 2020-08-18 NOTE — Patient Instructions (Signed)
Good to see you Back and left shoulder xray today Piriformis exercises and shoulder Keep hands within peripherial vision Tennis ball back right pocket with long durations See me again in 6 weeks

## 2020-08-18 NOTE — Assessment & Plan Note (Signed)
Right piriformis syndrome.  Patient given home exercises, discussed which activities that he wants wants to avoid.  Discussed minimal thought of being beneficial.  Discussed avoiding certain activities and x-rays of the lumbar spine to rule out any type of lumbar radiculopathy.  Patient would like to avoid medications if possible can consider the possibility of gabapentin.  X-rays of the back ordered today to further evaluate.  Follow-up again 6 weeks.

## 2020-09-12 MED FILL — CARISOPRODOL 350 MG TABS: 350 | 20 days supply | Qty: 40 | Fill #2

## 2020-09-16 ENCOUNTER — Other Ambulatory Visit (HOSPITAL_BASED_OUTPATIENT_CLINIC_OR_DEPARTMENT_OTHER): Payer: Self-pay

## 2020-09-16 MED FILL — Zolpidem Tartrate Tab 10 MG: ORAL | 30 days supply | Qty: 30 | Fill #0 | Status: AC

## 2020-09-25 ENCOUNTER — Other Ambulatory Visit (HOSPITAL_COMMUNITY): Payer: Self-pay

## 2020-09-25 MED ORDER — ALPRAZOLAM 0.25 MG PO TABS
0.2500 mg | ORAL_TABLET | Freq: Every day | ORAL | 0 refills | Status: DC
  Filled 2020-09-25: qty 6, 6d supply, fill #0
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
## 2020-09-30 ENCOUNTER — Encounter: Payer: Self-pay | Admitting: Family Medicine

## 2020-09-30 ENCOUNTER — Other Ambulatory Visit: Payer: Self-pay

## 2020-09-30 ENCOUNTER — Ambulatory Visit: Payer: 59 | Admitting: Family Medicine

## 2020-09-30 DIAGNOSIS — G5701 Lesion of sciatic nerve, right lower limb: Secondary | ICD-10-CM | POA: Diagnosis not present

## 2020-09-30 DIAGNOSIS — M25512 Pain in left shoulder: Secondary | ICD-10-CM | POA: Diagnosis not present

## 2020-09-30 DIAGNOSIS — M1710 Unilateral primary osteoarthritis, unspecified knee: Secondary | ICD-10-CM

## 2020-09-30 NOTE — Assessment & Plan Note (Signed)
Underlying arthritis noted.  Patient still has some limited range of motion but is doing relatively well.  Patient has responded fairly well to physical therapy.  Increase activity slowly and follow-up with me exam in 6 weeks

## 2020-09-30 NOTE — Assessment & Plan Note (Signed)
Patellofemoral arthritis of both knees.  Discussed with patient.  Discussed avoiding certain activities and the strengthening of the VMO.  Discussed icing regimen and home exercises, increase activity slowly.  Worsening pain can consider the possibility of injections.  Patient does have meloxicam for breakthrough.  Follow-up with me again in 6 weeks

## 2020-09-30 NOTE — Assessment & Plan Note (Signed)
Patient does have some mild piriformis syndrome.  Improvement from previous exam.  Discussed which activities to do which wants to avoid.  Increase activity slowly.  Discussed icing regimen and home exercises.  Follow-up again 6 to 8 weeks.

## 2020-09-30 NOTE — Patient Instructions (Addendum)
Good to see you Exercises for the knee Voltaren gel over the counter Ice 20 mins 2 times a day Keep wearing good shoes See me again in 6-8 weeks

## 2020-10-17 ENCOUNTER — Other Ambulatory Visit (HOSPITAL_BASED_OUTPATIENT_CLINIC_OR_DEPARTMENT_OTHER): Payer: Self-pay

## 2020-10-17 ENCOUNTER — Encounter: Payer: Self-pay | Admitting: Family Medicine

## 2020-10-17 ENCOUNTER — Telehealth (INDEPENDENT_AMBULATORY_CARE_PROVIDER_SITE_OTHER): Payer: 59 | Admitting: Family Medicine

## 2020-10-17 ENCOUNTER — Other Ambulatory Visit: Payer: Self-pay

## 2020-10-17 DIAGNOSIS — U071 COVID-19: Secondary | ICD-10-CM | POA: Diagnosis not present

## 2020-10-17 MED ORDER — PROMETHAZINE-CODEINE 6.25-10 MG/5ML PO SYRP
5.0000 mL | ORAL_SOLUTION | Freq: Four times a day (QID) | ORAL | 0 refills | Status: DC | PRN
  Filled 2020-10-17: qty 180, 9d supply, fill #0

## 2020-10-17 MED ORDER — BENZONATATE 100 MG PO CAPS
100.0000 mg | ORAL_CAPSULE | Freq: Three times a day (TID) | ORAL | 0 refills | Status: DC | PRN
  Filled 2020-10-17: qty 30, 10d supply, fill #0

## 2020-10-17 NOTE — Progress Notes (Signed)
Chief Complaint  Patient presents with  . Covid Positive  . Cough    Shannon [name] here for URI complaints. Due to COVID-19 pandemic, we are interacting via web portal for an electronic face-to-face visit. I verified patient's ID using 2 identifiers. Patient agreed to proceed with visit via this method. Patient is at home, I am at office. Patient and I are present for visit.   Duration: 3 days  Associated symptoms: sinus congestion, rhinorrhea, sore throat and cough Denies: sinus pain, itchy watery eyes, ear pain, ear drainage, wheezing, shortness of breath, myalgia and fevers Treatment to date: Claritin, Tylenol Sick contacts: No  She is vaccinated and boosted.   Past Medical History:  Diagnosis Date  . History of chicken pox 08/08/2015  . Hyperlipidemia, mild 06/17/2016  . Insomnia related to another mental disorder 06/24/2017  . Preventative health care 08/17/2015  . Unspecified viral hepatitis C without hepatic coma 08/17/2015  . Vitamin D deficiency 06/17/2016   Objective No conversational dyspnea Age appropriate judgment and insight Nml affect and mood  COVID-19 - Plan: benzonatate (TESSALON) 100 MG capsule, promethazine-codeine (PHENERGAN WITH CODEINE) 6.25-10 MG/5ML syrup  Discussed Paxlovid. As she is having improved s/s's, will focus on symptom control w above. Warnings for codeine cough syrup verbalized and written down.  Continue to push fluids, practice good hand hygiene, cover mouth when coughing. F/u prn. If starting to experience irreplaceable fluid loss, shaking, or shortness of breath, seek immediate care. Pt voiced understanding and agreement to the plan.  [place], DO 10/17/20 3:37 PM

## 2020-10-21 ENCOUNTER — Other Ambulatory Visit (HOSPITAL_COMMUNITY): Payer: Self-pay

## 2020-10-21 MED FILL — Estradiol TD Patch Twice Weekly 0.05 MG/24HR: TRANSDERMAL | 84 days supply | Qty: 24 | Fill #0 | Status: AC

## 2020-11-12 ENCOUNTER — Other Ambulatory Visit (HOSPITAL_BASED_OUTPATIENT_CLINIC_OR_DEPARTMENT_OTHER): Payer: Self-pay

## 2020-11-12 MED FILL — Zolpidem Tartrate Tab 10 MG: ORAL | 30 days supply | Qty: 30 | Fill #1 | Status: AC

## 2020-11-18 ENCOUNTER — Ambulatory Visit: Payer: 59 | Admitting: Family Medicine

## 2020-12-30 ENCOUNTER — Other Ambulatory Visit: Payer: Self-pay | Admitting: Family Medicine

## 2020-12-30 MED ORDER — ZOLPIDEM TARTRATE 10 MG PO TABS
ORAL_TABLET | ORAL | 2 refills | Status: DC
  Filled 2020-12-30: qty 30, 30d supply, fill #0
  Filled 2021-02-03: qty 30, 30d supply, fill #1
  Filled 2021-04-06: qty 30, 30d supply, fill #2

## 2020-12-30 NOTE — Telephone Encounter (Signed)
Requesting: zolpidem Contract: none UDS: none Last Visit: 07/21/20 Next Visit: 01/20/21 Last Refill:07/21/20  Please Advise

## 2020-12-31 ENCOUNTER — Other Ambulatory Visit (HOSPITAL_BASED_OUTPATIENT_CLINIC_OR_DEPARTMENT_OTHER): Payer: Self-pay

## 2021-01-19 ENCOUNTER — Other Ambulatory Visit (HOSPITAL_COMMUNITY): Payer: Self-pay

## 2021-01-19 MED ORDER — MEDROXYPROGESTERONE ACETATE 5 MG PO TABS
5.0000 mg | ORAL_TABLET | Freq: Every day | ORAL | 3 refills | Status: DC
  Filled 2021-01-19: qty 90, 90d supply, fill #0
  Filled 2021-04-06 – 2021-04-20 (×2): qty 90, 90d supply, fill #1
  Filled 2021-07-07: qty 90, 90d supply, fill #2
  Filled 2021-10-19: qty 90, 90d supply, fill #3

## 2021-01-19 MED ORDER — ESTRADIOL 0.05 MG/24HR TD PTTW
1.0000 | MEDICATED_PATCH | TRANSDERMAL | 3 refills | Status: DC
  Filled 2021-01-19: qty 24, 84d supply, fill #0
  Filled 2021-04-06 – 2021-04-20 (×2): qty 24, 84d supply, fill #1

## 2021-01-20 ENCOUNTER — Telehealth: Payer: Self-pay

## 2021-01-20 ENCOUNTER — Telehealth: Payer: 59 | Admitting: Family Medicine

## 2021-01-20 ENCOUNTER — Other Ambulatory Visit: Payer: Self-pay

## 2021-01-20 DIAGNOSIS — D696 Thrombocytopenia, unspecified: Secondary | ICD-10-CM

## 2021-01-21 NOTE — Progress Notes (Signed)
Patient not seen.

## 2021-01-21 NOTE — Telephone Encounter (Signed)
Pt scheduled  

## 2021-01-23 ENCOUNTER — Telehealth: Payer: Self-pay

## 2021-01-23 ENCOUNTER — Other Ambulatory Visit: Payer: Self-pay

## 2021-01-23 ENCOUNTER — Ambulatory Visit: Payer: 59 | Admitting: Family Medicine

## 2021-01-23 ENCOUNTER — Telehealth (INDEPENDENT_AMBULATORY_CARE_PROVIDER_SITE_OTHER): Payer: 59 | Admitting: Family Medicine

## 2021-01-23 DIAGNOSIS — Z0000 Encounter for general adult medical examination without abnormal findings: Secondary | ICD-10-CM

## 2021-01-23 DIAGNOSIS — D649 Anemia, unspecified: Secondary | ICD-10-CM | POA: Diagnosis not present

## 2021-01-23 DIAGNOSIS — E559 Vitamin D deficiency, unspecified: Secondary | ICD-10-CM

## 2021-01-23 DIAGNOSIS — E785 Hyperlipidemia, unspecified: Secondary | ICD-10-CM | POA: Diagnosis not present

## 2021-01-23 DIAGNOSIS — R002 Palpitations: Secondary | ICD-10-CM | POA: Diagnosis not present

## 2021-01-23 DIAGNOSIS — D696 Thrombocytopenia, unspecified: Secondary | ICD-10-CM

## 2021-01-23 NOTE — Telephone Encounter (Signed)
LVM to call and schedule for lab appointment a week before physical in Feb.

## 2021-01-24 NOTE — Assessment & Plan Note (Signed)
Encourage heart healthy diet such as MIND or DASH diet, increase exercise, avoid trans fats, simple carbohydrates and processed foods, consider a krill or fish or flaxseed oil cap daily.  °

## 2021-01-24 NOTE — Assessment & Plan Note (Signed)
No significant flare despite notable stress. 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 He is tolerating his hormone based therapy but it has still been stressful

## 2021-01-24 NOTE — Assessment & Plan Note (Signed)
Supplement and monitor 
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
## 2021-01-27 ENCOUNTER — Encounter: Payer: Self-pay | Admitting: Family Medicine

## 2021-02-03 ENCOUNTER — Other Ambulatory Visit (HOSPITAL_BASED_OUTPATIENT_CLINIC_OR_DEPARTMENT_OTHER): Payer: Self-pay

## 2021-02-23 ENCOUNTER — Other Ambulatory Visit (HOSPITAL_BASED_OUTPATIENT_CLINIC_OR_DEPARTMENT_OTHER): Payer: Self-pay | Admitting: Family Medicine

## 2021-02-23 DIAGNOSIS — Z1231 Encounter for screening mammogram for malignant neoplasm of breast: Secondary | ICD-10-CM

## 2021-02-25 DIAGNOSIS — L7211 Pilar cyst: Secondary | ICD-10-CM | POA: Diagnosis not present

## 2021-02-27 ENCOUNTER — Other Ambulatory Visit (HOSPITAL_COMMUNITY): Payer: Self-pay

## 2021-02-27 MED ORDER — CARESTART COVID-19 HOME TEST VI KIT
PACK | 0 refills | Status: DC
  Filled 2021-02-27: qty 4, 4d supply, fill #0

## 2021-03-09 ENCOUNTER — Telehealth: Payer: Self-pay | Admitting: *Deleted

## 2021-03-09 ENCOUNTER — Encounter: Payer: Self-pay | Admitting: Family Medicine

## 2021-03-09 NOTE — Telephone Encounter (Signed)
Left patient a message with office information.

## 2021-03-23 ENCOUNTER — Ambulatory Visit (HOSPITAL_BASED_OUTPATIENT_CLINIC_OR_DEPARTMENT_OTHER)
Admission: RE | Admit: 2021-03-23 | Discharge: 2021-03-23 | Disposition: A | Discharge status: Home / Self Care | Payer: 59 | Source: Ambulatory Visit | Attending: Family Medicine | Admitting: Family Medicine

## 2021-03-23 ENCOUNTER — Other Ambulatory Visit: Payer: Self-pay

## 2021-03-23 DIAGNOSIS — Z1231 Encounter for screening mammogram for malignant neoplasm of breast: Secondary | ICD-10-CM | POA: Insufficient documentation

## 2021-04-07 ENCOUNTER — Other Ambulatory Visit (HOSPITAL_COMMUNITY): Payer: Self-pay

## 2021-04-09 ENCOUNTER — Other Ambulatory Visit (HOSPITAL_COMMUNITY)
Admission: RE | Admit: 2021-04-09 | Discharge: 2021-04-09 | Disposition: A | Discharge status: Home / Self Care | Payer: 59 | Source: Ambulatory Visit | Attending: Obstetrics & Gynecology | Admitting: Obstetrics & Gynecology

## 2021-04-09 ENCOUNTER — Other Ambulatory Visit: Payer: Self-pay

## 2021-04-09 ENCOUNTER — Encounter: Payer: Self-pay | Admitting: Obstetrics & Gynecology

## 2021-04-09 ENCOUNTER — Ambulatory Visit: Payer: 59 | Admitting: Obstetrics & Gynecology

## 2021-04-09 ENCOUNTER — Other Ambulatory Visit (HOSPITAL_COMMUNITY): Payer: Self-pay

## 2021-04-09 VITALS — BP 124/74 | HR 77 | Resp 16 | Ht 64.0 in | Wt 134.0 lb

## 2021-04-09 DIAGNOSIS — Z79890 Hormone replacement therapy (postmenopausal): Secondary | ICD-10-CM | POA: Diagnosis not present

## 2021-04-09 DIAGNOSIS — N950 Postmenopausal bleeding: Secondary | ICD-10-CM

## 2021-04-09 NOTE — Progress Notes (Deleted)
Health Maintenance: PCP Dr. Penni Homans, [place] Bone density: 06/2017; normal Colonoscopy: 01/2012  Mammogram: 08/26/2017; Gets these annually in [place]; knows that she is due in march and is planning on making that appointment PAP Smear: 06/16/2016; Neg with neg HPV;  History of dysplasia: No history of CIN2/3; LGSIL 2008, but no tx needed and nl paps since; Neg pap 2010, neg pap 2013 with negative HPV cotesting, and neg pap and cotesting again 06/2016.
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
## 2021-04-09 NOTE — Patient Instructions (Signed)

## 2021-04-10 ENCOUNTER — Other Ambulatory Visit (HOSPITAL_COMMUNITY): Payer: Self-pay

## 2021-04-10 LAB — SURGICAL PATHOLOGY

## 2021-04-15 ENCOUNTER — Encounter: Payer: Self-pay | Admitting: Obstetrics & Gynecology

## 2021-04-15 DIAGNOSIS — N950 Postmenopausal bleeding: Secondary | ICD-10-CM | POA: Insufficient documentation

## 2021-04-16 LAB — CYTOLOGY - PAP
Comment: NEGATIVE
Diagnosis: NEGATIVE
High risk HPV: NEGATIVE

## 2021-04-20 ENCOUNTER — Other Ambulatory Visit (HOSPITAL_COMMUNITY): Payer: Self-pay

## 2021-04-20 ENCOUNTER — Ambulatory Visit (INDEPENDENT_AMBULATORY_CARE_PROVIDER_SITE_OTHER): Payer: 59

## 2021-04-20 ENCOUNTER — Other Ambulatory Visit: Payer: Self-pay

## 2021-04-20 DIAGNOSIS — N950 Postmenopausal bleeding: Secondary | ICD-10-CM

## 2021-04-20 DIAGNOSIS — Z79890 Hormone replacement therapy (postmenopausal): Secondary | ICD-10-CM | POA: Diagnosis not present

## 2021-04-20 DIAGNOSIS — N838 Other noninflammatory disorders of ovary, fallopian tube and broad ligament: Secondary | ICD-10-CM | POA: Diagnosis not present

## 2021-05-04 ENCOUNTER — Ambulatory Visit (INDEPENDENT_AMBULATORY_CARE_PROVIDER_SITE_OTHER): Payer: 59 | Admitting: Obstetrics & Gynecology

## 2021-05-04 ENCOUNTER — Encounter: Payer: Self-pay | Admitting: Obstetrics & Gynecology

## 2021-05-04 ENCOUNTER — Other Ambulatory Visit: Payer: Self-pay

## 2021-05-04 ENCOUNTER — Other Ambulatory Visit (HOSPITAL_BASED_OUTPATIENT_CLINIC_OR_DEPARTMENT_OTHER): Payer: Self-pay

## 2021-05-04 VITALS — BP 147/81 | HR 76 | Ht 64.0 in | Wt 134.0 lb

## 2021-05-04 DIAGNOSIS — Z01419 Encounter for gynecological examination (general) (routine) without abnormal findings: Secondary | ICD-10-CM | POA: Diagnosis not present

## 2021-05-04 MED ORDER — ESTRADIOL 0.025 MG/24HR TD PTTW
1.0000 | MEDICATED_PATCH | TRANSDERMAL | 12 refills | Status: DC
  Filled 2021-05-04: qty 8, 28d supply, fill #0
  Filled 2021-07-07: qty 8, 28d supply, fill #1
  Filled 2021-08-08: qty 8, 28d supply, fill #2
  Filled 2021-09-06: qty 24, 84d supply, fill #3
  Filled 2021-12-07: qty 24, 84d supply, fill #4

## 2021-05-04 NOTE — Progress Notes (Signed)
Pap smear done on 04/09/21- negative Mammogram done on 03/23/21- normal

## 2021-05-04 NOTE — Progress Notes (Addendum)
Subjective:     Shannon [name] is a 65 y.o. female here for a routine exam.  Current complaints: was bleeding on HRT and had negative endometrial biopsy and [place] (lining 2 mm).  Patient aware of the risk of hormone replacement therapy.  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  She started out on a very high dose of Vivelle patch it is down to 0.05.  She is amendable to decreasing to 0.025.     Gynecologic History No LMP recorded. Patient is postmenopausal. Contraception: post menopausal status Last Pap: 2022. Results were: normal Last mammogram: 10/22. Results were: normal  Obstetric History OB History  Gravida Para Term Preterm AB Living  4 4 4     4   SAB IAB Ectopic Multiple Live Births          4    # Outcome Date GA Lbr Len/2nd Weight Sex Delivery Anes PTL Lv  4 Term      Vag-Spont     3 Term      Vag-Spont     2 Term      Vag-Spont     1 Term      Vag-Spont        The following portions of the patient's history were reviewed and updated as appropriate: allergies, current medications, past family history, past medical history, past social history, past surgical history, and problem list.  Review of Systems Pertinent items noted in HPI and remainder of comprehensive ROS otherwise negative.    Objective:     Vitals:   05/04/21 0850  BP: (!) 147/81  Pulse: 76  Weight: 134 lb (60.8 kg)  Height: 5\' 4"  (1.626 m)   Vitals:  WNL General appearance: alert, cooperative and no distress  HEENT: Normocephalic, without obvious abnormality, atraumatic Eyes: negative Throat: lips, mucosa, and tongue normal; teeth and gums normal  Respiratory: Clear to auscultation bilaterally  CV: Regular rate and rhythm  Breasts:  Normal appearance, no masses or tenderness, no nipple retraction or dimpling  GI: Soft, non-tender; bowel sounds normal; no masses,  no organomegaly  GU: Pt deferred exam due to pelvic exam earlier this month.    Vagina: Pt deferred exam due to pelvic exam  earlier this month.   Cervix: Pt deferred exam due to pelvic exam earlier this month.   Uterus:  Pt deferred exam due to pelvic exam earlier this month.   Adnexa: Pt deferred exam due to pelvic exam earlier this month.   Musculoskeletal: No edema, redness or tenderness in the calves or thighs  Skin: No lesions or rash  Lymphatic: Axillary adenopathy: none     Psychiatric: Normal mood and behavior        Assessment:    Healthy female exam.  HRT > 10 years   Plan:   Pap normal and up to date.  Pt will stop pap smears at this time (age >4 with no history of dysplasia) Mammogram normal No problems with uterus on ultrasound nor biopsy for postmenopausal bleeding on HRT.  As above, patient agrees to decrease Vivelle-Dot to 0.025 she will then wean off HRT completely.  If things are untenable she will apply patch, restart progesterone, and make appointment to discuss nonhormonal medications. Bone Health:  ASSESSMENT: The BMD measured at Femur Neck Left is 0.942 g/cm2 with a T-score of -0.7. This patient is considered normal according to [facility]) criteria. Lumbar spine was not utilized due to advanced degenerative changes.  Shannon [name] will maximize  her calcium and vitamin D intake.  No evidence of osteoporosis or osteopenia at this time. BP slightly elevated today.  Patient will take blood pressure at work and let us know if it is abnormal.

## 2021-05-13 ENCOUNTER — Telehealth: Payer: 59 | Admitting: Physician Assistant

## 2021-05-13 ENCOUNTER — Other Ambulatory Visit (HOSPITAL_BASED_OUTPATIENT_CLINIC_OR_DEPARTMENT_OTHER): Payer: Self-pay

## 2021-05-13 DIAGNOSIS — H1012 Acute atopic conjunctivitis, left eye: Secondary | ICD-10-CM

## 2021-05-13 MED ORDER — AZELASTINE HCL 0.05 % OP SOLN
1.0000 [drp] | Freq: Two times a day (BID) | OPHTHALMIC | 0 refills | Status: DC
  Filled 2021-05-13: qty 6, 60d supply, fill #0

## 2021-05-13 NOTE — Progress Notes (Signed)
I have spent 5 minutes in review of e-visit questionnaire, review and updating patient chart, medical decision making and response to patient.   Denine Brotz Cody Zameer Borman, PA-C    

## 2021-05-13 NOTE — Progress Notes (Signed)
E-Visit for [employer]   We are sorry that you are not feeling well.  Here is how we plan to help!  Based on what you have shared with me it looks like you have conjunctivitis.  Conjunctivitis is a common inflammatory or infectious condition of the eye that is often referred to as "pink eye".  In most cases it is contagious (viral or bacterial). However, not all conjunctivitis requires antibiotics (ex. Allergic).  We have made appropriate suggestions for you based upon your presentation.  I have prescribed Optivar drops to use as directed for the next 4-5 days.  Pink eye can be highly contagious.  It is typically spread through direct contact with secretions, or contaminated objects or surfaces that one may have touched.  Strict handwashing is suggested with soap and water is urged.  If not available, use alcohol based had sanitizer.  Avoid unnecessary touching of the eye.  If you wear contact lenses, you will need to refrain from wearing them until you see no white discharge from the eye for at least 24 hours after being on medication.  You should see symptom improvement in 1-2 days after starting the medication regimen.  Call us if symptoms are not improved in 1-2 days.  Home Care: Wash your hands often! Do not wear your contacts until you complete your treatment plan. Avoid sharing towels, bed linen, personal items with a person who has pink eye. See attention for anyone in your home with similar symptoms.  Get Help Right Away If: Your symptoms do not improve. You develop blurred or loss of vision. Your symptoms worsen (increased discharge, pain or redness)   Thank you for choosing an e-visit.  Your e-visit answers were reviewed by a board certified advanced clinical practitioner to complete your personal care plan. Depending upon the condition, your plan could have included both over the counter or prescription medications.  Please review your pharmacy choice. Make sure the pharmacy is  open so you can pick up prescription now. If there is a problem, you may contact your provider through [employer] and have the prescription routed to another pharmacy.  Your safety is important to [place]. If you have drug allergies check your prescription carefully.   For the next 24 hours you can use MyChart to ask questions about today's visit, request a non-urgent call back, or ask for a work or school excuse. You will get an email in the next two days asking about your experience. I hope that your e-visit has been valuable and will speed your recovery.

## 2021-05-26 ENCOUNTER — Other Ambulatory Visit (HOSPITAL_BASED_OUTPATIENT_CLINIC_OR_DEPARTMENT_OTHER): Payer: Self-pay

## 2021-05-26 DIAGNOSIS — L578 Other skin changes due to chronic exposure to nonionizing radiation: Secondary | ICD-10-CM | POA: Diagnosis not present

## 2021-05-26 DIAGNOSIS — L821 Other seborrheic keratosis: Secondary | ICD-10-CM | POA: Diagnosis not present

## 2021-05-26 DIAGNOSIS — L814 Other melanin hyperpigmentation: Secondary | ICD-10-CM | POA: Diagnosis not present

## 2021-05-26 DIAGNOSIS — Z23 Encounter for immunization: Secondary | ICD-10-CM | POA: Diagnosis not present

## 2021-05-26 DIAGNOSIS — Z411 Encounter for cosmetic surgery: Secondary | ICD-10-CM | POA: Diagnosis not present

## 2021-05-26 DIAGNOSIS — D1801 Hemangioma of skin and subcutaneous tissue: Secondary | ICD-10-CM | POA: Diagnosis not present

## 2021-05-26 DIAGNOSIS — D229 Melanocytic nevi, unspecified: Secondary | ICD-10-CM | POA: Diagnosis not present

## 2021-05-26 MED ORDER — TRETINOIN 0.025 % EX CREA
TOPICAL_CREAM | CUTANEOUS | 1 refills | Status: DC
  Filled 2021-05-26: qty 45, 30d supply, fill #0

## 2021-05-27 ENCOUNTER — Other Ambulatory Visit (HOSPITAL_BASED_OUTPATIENT_CLINIC_OR_DEPARTMENT_OTHER): Payer: Self-pay

## 2021-05-29 ENCOUNTER — Other Ambulatory Visit (HOSPITAL_BASED_OUTPATIENT_CLINIC_OR_DEPARTMENT_OTHER): Payer: Self-pay

## 2021-06-10 ENCOUNTER — Other Ambulatory Visit: Payer: Self-pay

## 2021-06-10 ENCOUNTER — Other Ambulatory Visit (HOSPITAL_COMMUNITY): Payer: Self-pay

## 2021-06-11 ENCOUNTER — Other Ambulatory Visit: Payer: Self-pay | Admitting: Family Medicine

## 2021-06-11 ENCOUNTER — Other Ambulatory Visit (HOSPITAL_COMMUNITY): Payer: Self-pay

## 2021-06-11 MED ORDER — ZOLPIDEM TARTRATE 10 MG PO TABS
10.0000 mg | ORAL_TABLET | Freq: Every evening | ORAL | 0 refills | Status: DC | PRN
  Filled 2021-06-11: qty 30, 30d supply, fill #0

## 2021-06-11 NOTE — Telephone Encounter (Signed)
[facility]  Requesting: Ambien 10mg   Contract: None UDS: None Last Visit: 01/23/2021 Next Visit: 07/20/2021 Last Refill: 12/30/2020 #30 and 0RF  Please Advise

## 2021-07-07 ENCOUNTER — Other Ambulatory Visit (HOSPITAL_BASED_OUTPATIENT_CLINIC_OR_DEPARTMENT_OTHER): Payer: Self-pay

## 2021-07-20 ENCOUNTER — Other Ambulatory Visit (INDEPENDENT_AMBULATORY_CARE_PROVIDER_SITE_OTHER): Payer: 59

## 2021-07-20 DIAGNOSIS — E559 Vitamin D deficiency, unspecified: Secondary | ICD-10-CM

## 2021-07-20 DIAGNOSIS — E785 Hyperlipidemia, unspecified: Secondary | ICD-10-CM | POA: Diagnosis not present

## 2021-07-20 DIAGNOSIS — Z0000 Encounter for general adult medical examination without abnormal findings: Secondary | ICD-10-CM

## 2021-07-20 DIAGNOSIS — D649 Anemia, unspecified: Secondary | ICD-10-CM | POA: Diagnosis not present

## 2021-07-20 DIAGNOSIS — D696 Thrombocytopenia, unspecified: Secondary | ICD-10-CM

## 2021-07-20 LAB — CBC WITH DIFFERENTIAL/PLATELET
Basophils Absolute: 0 10*3/uL (ref 0.0–0.1)
Basophils Relative: 1.1 % (ref 0.0–3.0)
Eosinophils Absolute: 0 10*3/uL (ref 0.0–0.7)
Eosinophils Relative: 1.6 % (ref 0.0–5.0)
HCT: 36 % (ref 36.0–46.0)
Hemoglobin: 12.1 g/dL (ref 12.0–15.0)
Lymphocytes Relative: 35.3 % (ref 12.0–46.0)
Lymphs Abs: 1 10*3/uL (ref 0.7–4.0)
MCHC: 33.7 g/dL (ref 30.0–36.0)
MCV: 96.4 fl (ref 78.0–100.0)
Monocytes Absolute: 0.3 10*3/uL (ref 0.1–1.0)
Monocytes Relative: 10.1 % (ref 3.0–12.0)
Neutro Abs: 1.5 10*3/uL (ref 1.4–7.7)
Neutrophils Relative %: 51.9 % (ref 43.0–77.0)
Platelets: 118 10*3/uL — ABNORMAL LOW (ref 150.0–400.0)
RBC: 3.74 Mil/uL — ABNORMAL LOW (ref 3.87–5.11)
RDW: 13.2 % (ref 11.5–15.5)
WBC: 2.8 10*3/uL — ABNORMAL LOW (ref 4.0–10.5)

## 2021-07-20 LAB — COMPREHENSIVE METABOLIC PANEL
ALT: 18 U/L (ref 0–35)
AST: 22 U/L (ref 0–37)
Albumin: 4.2 g/dL (ref 3.5–5.2)
Alkaline Phosphatase: 22 U/L — ABNORMAL LOW (ref 39–117)
BUN: 14 mg/dL (ref 6–23)
CO2: 30 mEq/L (ref 19–32)
Calcium: 8.9 mg/dL (ref 8.4–10.5)
Chloride: 103 mEq/L (ref 96–112)
Creatinine, Ser: 0.72 mg/dL (ref 0.40–1.20)
GFR: 86.83 mL/min (ref >60.00)
Glucose, Bld: 91 mg/dL (ref 70–99)
Potassium: 3.8 mEq/L (ref 3.5–5.1)
Sodium: 136 mEq/L (ref 135–145)
Total Bilirubin: 0.7 mg/dL (ref 0.2–1.2)
Total Protein: 6 g/dL (ref 6.0–8.3)

## 2021-07-20 LAB — LIPID PANEL
Cholesterol: 187 mg/dL (ref 0–200)
HDL: 47.3 mg/dL (ref >39.00)
LDL Cholesterol: 125 mg/dL — ABNORMAL HIGH (ref 0–99)
NonHDL: 140.15
Total CHOL/HDL Ratio: 4
Triglycerides: 77 mg/dL (ref 0.0–149.0)
VLDL: 15.4 mg/dL (ref 0.0–40.0)

## 2021-07-20 LAB — TSH: TSH: 1.49 u[IU]/mL (ref 0.35–5.50)

## 2021-07-21 ENCOUNTER — Encounter: Payer: Self-pay | Admitting: Family Medicine

## 2021-07-22 ENCOUNTER — Other Ambulatory Visit: Payer: Self-pay

## 2021-07-22 ENCOUNTER — Telehealth: Payer: 59 | Admitting: Nurse Practitioner

## 2021-07-22 ENCOUNTER — Other Ambulatory Visit (HOSPITAL_BASED_OUTPATIENT_CLINIC_OR_DEPARTMENT_OTHER): Payer: Self-pay

## 2021-07-22 DIAGNOSIS — R21 Rash and other nonspecific skin eruption: Secondary | ICD-10-CM | POA: Diagnosis not present

## 2021-07-22 DIAGNOSIS — U071 COVID-19: Secondary | ICD-10-CM

## 2021-07-22 DIAGNOSIS — D649 Anemia, unspecified: Secondary | ICD-10-CM

## 2021-07-22 MED ORDER — BENZONATATE 100 MG PO CAPS
100.0000 mg | ORAL_CAPSULE | Freq: Two times a day (BID) | ORAL | 0 refills | Status: DC | PRN
  Filled 2021-07-22: qty 20, 10d supply, fill #0

## 2021-07-22 MED ORDER — NIRMATRELVIR/RITONAVIR (PAXLOVID)TABLET
3.0000 | ORAL_TABLET | Freq: Two times a day (BID) | ORAL | 0 refills | Status: AC
  Filled 2021-07-22: qty 30, 5d supply, fill #0

## 2021-07-22 NOTE — Progress Notes (Signed)
Virtual Visit Consent   Shannon [name], you are scheduled for a virtual visit with Mary-Margaret Hassell Done, [place], a [facility] provider, today.     Just as with appointments in the office, your consent must be obtained to participate.  Your consent will be active for this visit and any virtual visit you may have with one of our providers in the next 365 days.     If you have a MyChart account, a copy of this consent can be sent to you electronically.  All virtual visits are billed to your insurance company just like a traditional visit in the office.    As this is a virtual visit, video technology does not allow for your provider to perform a traditional examination.  This may limit your provider's ability to fully assess your condition.  If your provider identifies any concerns that need to be evaluated in person or the need to arrange testing (such as labs, EKG, etc.), we will make arrangements to do so.     Although advances in technology are sophisticated, we cannot ensure that it will always work on either your end or our end.  If the connection with a video visit is poor, the visit may have to be switched to a telephone visit.  With either a video or telephone visit, we are not always able to ensure that we have a secure connection.     I need to obtain your verbal consent now.   Are you willing to proceed with your visit today? YES   Shannon [name] has provided verbal consent on 07/22/2021 for a virtual visit (video or telephone).   Mary-Margaret Hassell Done, FNP   Date: 07/22/2021 1:44 PM   Virtual Visit via Video Note   I, Mary-Margaret Hassell Done, connected with Shannon [name] ([id number], 04-23-1956) on 07/22/21 at  2:00 PM EST by a video-enabled telemedicine application and verified that I am speaking with the correct person using two identifiers.  Location: Patient: Virtual Visit Location Patient: Home Provider: Virtual Visit Location Provider: Mobile   I discussed the  limitations of evaluation and management by telemedicine and the availability of in person appointments. The patient expressed understanding and agreed to proceed.    History of Present Illness: Shannon [name] is a 66 y.o. who identifies as a female who was assigned female at birth, and is being seen today for rash on eyelid. Marland Kitchen  HPI: Patient has a rash on her right upper lid. 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 Started about 2 weeks ago and it comes and goes. She also has not felt good for several days. Slight congetion and cough. Some nausea. Feel sdrained. Test positive for covid 10 minutes ago.   Review of Systems  Constitutional:  Positive for malaise/fatigue. Negative for chills and fever.  HENT:  Positive for congestion. Negative for nosebleeds and sinus pain.   Respiratory:  Negative for cough.   Musculoskeletal:  Negative for myalgias.  Neurological:  Negative for dizziness and headaches.   Problems:  Patient Active Problem List   Diagnosis Date Noted   PMB (postmenopausal bleeding) 04/15/2021   Patellofemoral arthritis 09/30/2020   Piriformis syndrome of right side 08/18/2020   Left shoulder pain 08/18/2020   Thrombocytopenia ([place]) 07/24/2019   Palpitations 06/26/2018   Skin lesion 06/26/2018   Right hip pain 06/26/2018   Insomnia 06/24/2017   Plantar fasciitis 09/08/2016   Trochanteric bursitis, left hip 06/29/2016   Anemia 06/19/2016   Morton's metatarsalgia 06/19/2016   Vitamin D  deficiency 06/17/2016   Hyperlipidemia, mild 06/17/2016   Unspecified viral hepatitis C without hepatic coma 08/17/2015   Preventative health care 08/17/2015   History of chicken pox 08/08/2015    Allergies:  Allergies  Allergen Reactions   Amoxicillin-Pot Clavulanate Dermatitis   Medications:  Current Outpatient Medications:    azelastine (OPTIVAR) 0.05 % ophthalmic solution, Place 1 drop into the left eye 2 (two) times daily., Disp: 6 mL, Rfl: 0   DOTTI 0.05 MG/24HR patch, , Disp: , Rfl:     estradiol (VIVELLE-DOT) 0.025 MG/24HR, Place 1 patch onto the skin 2 (two) times a week., Disp: 8 patch, Rfl: 12   medroxyPROGESTERone (PROVERA) 5 MG tablet, Take 5 mg by mouth daily., Disp: , Rfl:    medroxyPROGESTERone (PROVERA) 5 MG tablet, Take 1 tablet (5 mg total) by mouth daily., Disp: 90 tablet, Rfl: 3   tretinoin (RETIN-A) 0.025 % cream, Apply 1 application in the evening to face externally, pea size amount to whole face at night 30, Disp: 45 g, Rfl: 1   zolpidem (AMBIEN) 10 MG tablet, TAKE 1 TABLET (10 MG TOTAL) BY MOUTH AT BEDTIME AS NEEDED., Disp: 30 tablet, Rfl: 2   zolpidem (AMBIEN) 10 MG tablet, Take 1 tablet (10 mg total) by mouth at bedtime as needed for sleep, Disp: 30 tablet, Rfl: 0  Observations/Objective: Patient is well-developed, well-nourished in no acute distress.  Resting comfortably  at home.  Head is normocephalic, atraumatic.  No labored breathing.  Speech is clear and coherent with logical content.  Patient is alert and oriented at baseline.  Erythematous spots on right upper lid  Assessment and Plan:  [name] in today with chief complaint of Herpes Zoster   1. Rash Moisturzer Avoid scratchig or rubbing  No cortisone cream on eyelid  2. Positive self-administered antigen test for COVID-19 1. Take meds as prescribed 2. Use a cool mist humidifier especially during the winter months and when heat has been humid. 3. Use saline nose sprays frequently 4. Saline irrigations of the nose can be very helpful if done frequently.  * 4X daily for 1 week*  * Use of a nettie pot can be helpful with this. Follow directions with this* 5. Drink plenty of fluids 6. Keep thermostat turn down low 7.For any cough or congestion- tessalon perles 8. For fever or aces or pains- take tylenol or ibuprofen appropriate for age and weight.  * for fevers greater than 101 orally you may alternate ibuprofen and tylenol every  3 hours.    Meds ordered this encounter   Medications   nirmatrelvir/ritonavir EUA (PAXLOVID) 20 x 150 MG & 10 x 100MG  TABS    Sig: Take 3 tablets by mouth 2 (two) times daily for 5 days. (Take nirmatrelvir 150 mg two tablets twice daily for 5 days and ritonavir 100 mg one tablet twice daily for 5 days) Patient GFR is 86    Dispense:  30 tablet    Refill:  0    Order Specific Question:   Supervising Provider    Answer:   Sabra Heck, BRIAN [[id number]]   benzonatate (TESSALON) 100 MG capsule    Sig: Take 1 capsule (100 mg total) by mouth 2 (two) times daily as needed for cough.    Dispense:  20 capsule    Refill:  0    Order Specific Question:   Supervising Provider    Answer:   Noemi Chapel [[id number]]      Follow Up Instructions: I discussed the  assessment and treatment plan with the patient. The patient was provided an opportunity to ask questions and all were answered. The patient agreed with the plan and demonstrated an understanding of the instructions.  A copy of instructions were sent to the patient via MyChart.  The patient was advised to call back or seek an in-person evaluation if the symptoms worsen or if the condition fails to improve as anticipated.  Time:  I spent 11 minutes with the patient via telehealth technology discussing the above problems/concerns.    Mary-Margaret Hassell Done, FNP

## 2021-07-22 NOTE — Patient Instructions (Signed)
COVID-19: Quarantine and Isolation °Quarantine °If you were exposed °Quarantine and stay away from others when you have been in close contact with someone who has COVID-19. °Isolate °If you are sick or test positive °Isolate when you are sick or when you have COVID-19, even if you don't have symptoms. °When to stay home °Calculating quarantine °The date of your exposure is considered day 0. Day 1 is the first full day after your last contact with a person who has had COVID-19. Stay home and away from other people for at least 5 days. Learn why CDC updated guidance for the general public. °IF YOU were exposed to COVID-19 and are NOT  °up to dateIF YOU were exposed to COVID-19 and are NOT on COVID-19 vaccinations °Quarantine for at least 5 days °Stay home °Stay home and quarantine for at least 5 full days. °Wear a well-fitting mask if you must be around others in your home. °Do not travel. °Get tested °Even if you don't develop symptoms, get tested at least 5 days after you last had close contact with someone with COVID-19. °After quarantine °Watch for symptoms °Watch for symptoms until 10 days after you last had close contact with someone with COVID-19. °Avoid travel °It is best to avoid travel until a full 10 days after you last had close contact with someone with COVID-19. °If you develop symptoms °Isolate immediately and get tested. Continue to stay home until you know the results. Wear a well-fitting mask around others. °Take precautions until day 10 °Wear a well-fitting mask °Wear a well-fitting mask for 10 full days any time you are around others inside your home or in public. Do not go to places where you are unable to wear a well-fitting mask. °If you must travel during days 6-10, take precautions. °Avoid being around people who are more likely to get very sick from COVID-19. °IF YOU were exposed to COVID-19 and are  °up to dateIF YOU were exposed to COVID-19 and are on COVID-19 vaccinations °No  quarantine °You do not need to stay home unless you develop symptoms. °Get tested °Even if you don't develop symptoms, get tested at least 5 days after you last had close contact with someone with COVID-19. °Watch for symptoms °Watch for symptoms until 10 days after you last had close contact with someone with COVID-19. °If you develop symptoms °Isolate immediately and get tested. Continue to stay home until you know the results. Wear a well-fitting mask around others. °Take precautions until day 10 °Wear a well-fitting mask °Wear a well-fitting mask for 10 full days any time you are around others inside your home or in public. Do not go to places where you are unable to wear a well-fitting mask. °Take precautions if traveling °Avoid being around people who are more likely to get very sick from COVID-19. °IF YOU were exposed to COVID-19 and had confirmed COVID-19 within the past 90 days (you tested positive using a viral test) °No quarantine °You do not need to stay home unless you develop symptoms. °Watch for symptoms °Watch for symptoms until 10 days after you last had close contact with someone with COVID-19. °If you develop symptoms °Isolate immediately and get tested. Continue to stay home until you know the results. Wear a well-fitting mask around others. °Take precautions until day 10 °Wear a well-fitting mask °Wear a well-fitting mask for 10 full days any time you are around others inside your home or in public. Do not go to places where you are   unable to wear a well-fitting mask. °Take precautions if traveling °Avoid being around people who are more likely to get very sick from COVID-19. °Calculating isolation °Day 0 is your first day of symptoms or a positive viral test. Day 1 is the first full day after your symptoms developed or your test specimen was collected. If you have COVID-19 or have symptoms, isolate for at least 5 days. °IF YOU tested positive for COVID-19 or have symptoms, regardless of  vaccination status °Stay home for at least 5 days °Stay home for 5 days and isolate from others in your home. °Wear a well-fitting mask if you must be around others in your home. °Do not travel. °Ending isolation if you had symptoms °End isolation after 5 full days if you are fever-free for 24 hours (without the use of fever-reducing medication) and your symptoms are improving. °Ending isolation if you did NOT have symptoms °End isolation after at least 5 full days after your positive test. °If you got very sick from COVID-19 or have a weakened immune system °You should isolate for at least 10 days. Consult your doctor before ending isolation. °Take precautions until day 10 °Wear a well-fitting mask °Wear a well-fitting mask for 10 full days any time you are around others inside your home or in public. Do not go to places where you are unable to wear a well-fitting mask. °Do not travel °Do not travel until a full 10 days after your symptoms started or the date your positive test was taken if you had no symptoms. °Avoid being around people who are more likely to get very sick from COVID-19. °Definitions °Exposure °Contact with someone infected with SARS-CoV-2, the virus that causes COVID-19, in a way that increases the likelihood of getting infected with the virus. °Close contact °A close contact is someone who was less than 6 feet away from an infected person (laboratory-confirmed or a clinical diagnosis) for a cumulative total of 15 minutes or more over a 24-hour period. For example, three individual 5-minute exposures for a total of 15 minutes. People who are exposed to someone with COVID-19 after they completed at least 5 days of isolation are not considered close contacts. °Quarantine °Quarantine is a strategy used to prevent transmission of COVID-19 by keeping people who have been in close contact with someone with COVID-19 apart from others. °Who does not need to quarantine? °If you had close contact with  someone with COVID-19 and you are in one of the following groups, you do not need to quarantine. °You are up to date with your COVID-19 vaccines. °You had confirmed COVID-19 within the last 90 days (meaning you tested positive using a viral test). °If you are up to date with COVID-19 vaccines, you should wear a well-fitting mask around others for 10 days from the date of your last close contact with someone with COVID-19 (the date of last close contact is considered day 0). Get tested at least 5 days after you last had close contact with someone with COVID-19. If you test positive or develop COVID-19 symptoms, isolate from other people and follow recommendations in the Isolation section below. If you tested positive for COVID-19 with a viral test within the previous 90 days and subsequently recovered and remain without COVID-19 symptoms, you do not need to quarantine or get tested after close contact. You should wear a well-fitting mask around others for 10 days from the date of your last close contact with someone with COVID-19 (the date of last   close contact is considered day 0). If you have COVID-19 symptoms, get tested and isolate from other people and follow recommendations in the Isolation section below. °Who should quarantine? °If you come into close contact with someone with COVID-19, you should quarantine if you are not up to date on COVID-19 vaccines. This includes people who are not vaccinated. °What to do for quarantine °Stay home and away from other people for at least 5 days (day 0 through day 5) after your last contact with a person who has COVID-19. The date of your exposure is considered day 0. Wear a well-fitting mask when around others at home, if possible. °For 10 days after your last close contact with someone with COVID-19, watch for fever (100.4°F or greater), cough, shortness of breath, or other COVID-19 symptoms. °If you develop symptoms, get tested immediately and isolate until you receive  your test results. If you test positive, follow isolation recommendations. °If you do not develop symptoms, get tested at least 5 days after you last had close contact with someone with COVID-19. °If you test negative, you can leave your home, but continue to wear a well-fitting mask when around others at home and in public until 10 days after your last close contact with someone with COVID-19. °If you test positive, you should isolate for at least 5 days from the date of your positive test (if you do not have symptoms). If you do develop COVID-19 symptoms, isolate for at least 5 days from the date your symptoms began (the date the symptoms started is day 0). Follow recommendations in the isolation section below. °If you are unable to get a test 5 days after last close contact with someone with COVID-19, you can leave your home after day 5 if you have been without COVID-19 symptoms throughout the 5-day period. Wear a well-fitting mask for 10 days after your date of last close contact when around others at home and in public. °Avoid people who are have weakened immune systems or are more likely to get very sick from COVID-19, and nursing homes and other high-risk settings, until after at least 10 days. °If possible, stay away from people you live with, especially people who are at higher risk for getting very sick from COVID-19, as well as others outside your home throughout the full 10 days after your last close contact with someone with COVID-19. °If you are unable to quarantine, you should wear a well-fitting mask for 10 days when around others at home and in public. °If you are unable to wear a mask when around others, you should continue to quarantine for 10 days. Avoid people who have weakened immune systems or are more likely to get very sick from COVID-19, and nursing homes and other high-risk settings, until after at least 10 days. °See additional information about travel. °Do not go to places where you are  unable to wear a mask, such as restaurants and some gyms, and avoid eating around others at home and at work until after 10 days after your last close contact with someone with COVID-19. °After quarantine °Watch for symptoms until 10 days after your last close contact with someone with COVID-19. °If you have symptoms, isolate immediately and get tested. °Quarantine in high-risk congregate settings °In certain congregate settings that have high risk of secondary transmission (such as correctional and detention facilities, homeless shelters, or cruise ships), CDC recommends a 10-day quarantine for residents, regardless of vaccination and booster status. During periods of critical staffing   shortages, facilities may consider shortening the quarantine period for staff to ensure continuity of operations. Decisions to shorten quarantine in these settings should be made in consultation with state, local, tribal, or territorial health departments and should take into consideration the context and characteristics of the facility. CDC's setting-specific guidance provides additional recommendations for these settings. °Isolation °Isolation is used to separate people with confirmed or suspected COVID-19 from those without COVID-19. People who are in isolation should stay home until it's safe for them to be around others. At home, anyone sick or infected should separate from others, or wear a well-fitting mask when they need to be around others. People in isolation should stay in a specific "sick room" or area and use a separate bathroom if available. Everyone who has presumed or confirmed COVID-19 should stay home and isolate from other people for at least 5 full days (day 0 is the first day of symptoms or the date of the day of the positive viral test for asymptomatic persons). They should wear a mask when around others at home and in public for an additional 5 days. People who are confirmed to have COVID-19 or are showing  symptoms of COVID-19 need to isolate regardless of their vaccination status. This includes: °People who have a positive viral test for COVID-19, regardless of whether or not they have symptoms. °People with symptoms of COVID-19, including people who are awaiting test results or have not been tested. People with symptoms should isolate even if they do not know if they have been in close contact with someone with COVID-19. °What to do for isolation °Monitor your symptoms. If you have an emergency warning sign (including trouble breathing), seek emergency medical care immediately. °Stay in a separate room from other household members, if possible. °Use a separate bathroom, if possible. °Take steps to improve ventilation at home, if possible. °Avoid contact with other members of the household and pets. °Don't share personal household items, like cups, towels, and utensils. °Wear a well-fitting mask when you need to be around other people. °Learn more about what to do if you are sick and how to notify your contacts. °Ending isolation for people who had COVID-19 and had symptoms °If you had COVID-19 and had symptoms, isolate for at least 5 days. To calculate your 5-day isolation period, day 0 is your first day of symptoms. Day 1 is the first full day after your symptoms developed. You can leave isolation after 5 full days. °You can end isolation after 5 full days if you are fever-free for 24 hours without the use of fever-reducing medication and your other symptoms have improved (Loss of taste and smell may persist for weeks or months after recovery and need not delay the end of isolation). °You should continue to wear a well-fitting mask around others at home and in public for 5 additional days (day 6 through day 10) after the end of your 5-day isolation period. If you are unable to wear a mask when around others, you should continue to isolate for a full 10 days. Avoid people who have weakened immune systems or are more  likely to get very sick from COVID-19, and nursing homes and other high-risk settings, until after at least 10 days. °If you continue to have fever or your other symptoms have not improved after 5 days of isolation, you should wait to end your isolation until you are fever-free for 24 hours without the use of fever-reducing medication and your other symptoms have improved.   Continue to wear a well-fitting mask through day 10. Contact your healthcare provider if you have questions. °See additional information about travel. °Do not go to places where you are unable to wear a mask, such as restaurants and some gyms, and avoid eating around others at home and at work until a full 10 days after your first day of symptoms. °If an individual has access to a test and wants to test, the best approach is to use an antigen test1 towards the end of the 5-day isolation period. Collect the test sample only if you are fever-free for 24 hours without the use of fever-reducing medication and your other symptoms have improved (loss of taste and smell may persist for weeks or months after recovery and need not delay the end of isolation). If your test result is positive, you should continue to isolate until day 10. If your test result is negative, you can end isolation, but continue to wear a well-fitting mask around others at home and in public until day 10. Follow additional recommendations for masking and avoiding travel as described above. °1As noted in the labeling for authorized over-the counter antigen tests: Negative results should be treated as presumptive. Negative results do not rule out SARS-CoV-2 infection and should not be used as the sole basis for treatment or patient management decisions, including infection control decisions. To improve results, antigen tests should be used twice over a three-day period with at least 24 hours and no more than 48 hours between tests. °Note that these recommendations on ending isolation  do not apply to people who are moderately ill or very sick from COVID-19 or have weakened immune systems. See section below for recommendations for when to end isolation for these groups. °Ending isolation for people who tested positive for COVID-19 but had no symptoms °If you test positive for COVID-19 and never develop symptoms, isolate for at least 5 days. Day 0 is the day of your positive viral test (based on the date you were tested) and day 1 is the first full day after the specimen was collected for your positive test. You can leave isolation after 5 full days. °If you continue to have no symptoms, you can end isolation after at least 5 days. °You should continue to wear a well-fitting mask around others at home and in public until day 10 (day 6 through day 10). If you are unable to wear a mask when around others, you should continue to isolate for 10 days. Avoid people who have weakened immune systems or are more likely to get very sick from COVID-19, and nursing homes and other high-risk settings, until after at least 10 days. °If you develop symptoms after testing positive, your 5-day isolation period should start over. Day 0 is your first day of symptoms. Follow the recommendations above for ending isolation for people who had COVID-19 and had symptoms. °See additional information about travel. °Do not go to places where you are unable to wear a mask, such as restaurants and some gyms, and avoid eating around others at home and at work until 10 days after the day of your positive test. °If an individual has access to a test and wants to test, the best approach is to use an antigen test1 towards the end of the 5-day isolation period. If your test result is positive, you should continue to isolate until day 10. If your test result is positive, you can also choose to test daily and if your test result   is negative, you can end isolation, but continue to wear a well-fitting mask around others at home and in  public until day 10. Follow additional recommendations for masking and avoiding travel as described above. °1As noted in the labeling for authorized over-the counter antigen tests: Negative results should be treated as presumptive. Negative results do not rule out SARS-CoV-2 infection and should not be used as the sole basis for treatment or patient management decisions, including infection control decisions. To improve results, antigen tests should be used twice over a three-day period with at least 24 hours and no more than 48 hours between tests. °Ending isolation for people who were moderately or very sick from COVID-19 or have a weakened immune system °People who are moderately ill from COVID-19 (experiencing symptoms that affect the lungs like shortness of breath or difficulty breathing) should isolate for 10 days and follow all other isolation precautions. To calculate your 10-day isolation period, day 0 is your first day of symptoms. Day 1 is the first full day after your symptoms developed. If you are unsure if your symptoms are moderate, talk to a healthcare provider for further guidance. °People who are very sick from COVID-19 (this means people who were hospitalized or required intensive care or ventilation support) and people who have weakened immune systems might need to isolate at home longer. They may also require testing with a viral test to determine when they can be around others. CDC recommends an isolation period of at least 10 and up to 20 days for people who were very sick from COVID-19 and for people with weakened immune systems. Consult with your healthcare provider about when you can resume being around other people. If you are unsure if your symptoms are severe or if you have a weakened immune system, talk to a healthcare provider for further guidance. °People who have a weakened immune system should talk to their healthcare provider about the potential for reduced immune responses to  COVID-19 vaccines and the need to continue to follow current prevention measures (including wearing a well-fitting mask and avoiding crowds and poorly ventilated indoor spaces) to protect themselves against COVID-19 until advised otherwise by their healthcare provider. Close contacts of immunocompromised people--including household members--should also be encouraged to receive all recommended COVID-19 vaccine doses to help protect these people. °Isolation in high-risk congregate settings °In certain high-risk congregate settings that have high risk of secondary transmission and where it is not feasible to cohort people (such as correctional and detention facilities, homeless shelters, and cruise ships), CDC recommends a 10-day isolation period for residents. During periods of critical staffing shortages, facilities may consider shortening the isolation period for staff to ensure continuity of operations. Decisions to shorten isolation in these settings should be made in consultation with state, local, tribal, or territorial health departments and should take into consideration the context and characteristics of the facility. CDC's setting-specific guidance provides additional recommendations for these settings. °This CDC guidance is meant to supplement--not replace--any federal, state, local, territorial, or tribal health and safety laws, rules, and regulations. °Recommendations for specific settings °These recommendations do not apply to healthcare professionals. For guidance specific to these settings, see °Healthcare professionals: Interim Guidance for Managing Healthcare Personnel with SARS-CoV-2 Infection or Exposure to SARS-CoV-2 °Patients, residents, and visitors to healthcare settings: Interim Infection Prevention and Control Recommendations for Healthcare Personnel During the Coronavirus Disease 2019 (COVID-19) Pandemic °Additional setting-specific guidance and recommendations are available. °These  recommendations on quarantine and isolation do apply to K[employer]12 School   settings. Additional guidance is available here: Overview of COVID-19 Quarantine for K-12 Schools °Travelers: Travel information and recommendations °Congregate facilities and other settings: guidance pages for community, work, and school settings °Ongoing COVID-19 exposure FAQs °I live with someone with COVID-19, but I cannot be separated from them. How do we manage quarantine in this situation? °It is very important for people with COVID-19 to remain apart from other people, if possible, even if they are living together. If separation of the person with COVID-19 from others that they live with is not possible, the other people that they live with will have ongoing exposure, meaning they will be repeatedly exposed until that person is no longer able to spread the virus to other people. In this situation, there are precautions you can take to limit the spread of COVID-19: °The person with COVID-19 and everyone they live with should wear a well-fitting mask inside the home. °If possible, one person should care for the person with COVID-19 to limit the number of people who are in close contact with the infected person. °Take steps to protect yourself and others to reduce transmission in the home: °Quarantine if you are not up to date with your COVID-19 vaccines. °Isolate if you are sick or tested positive for COVID-19, even if you don't have symptoms. °Learn more about the public health recommendations for testing, mask use and quarantine of close contacts, like yourself, who have ongoing exposure. These recommendations differ depending on your vaccination status. °What should I do if I have ongoing exposure to COVID-19 from someone I live with? °Recommendations for this situation depend on your vaccination status: °If you are not up to date on COVID-19 vaccines and have ongoing exposure to COVID-19, you should: °Begin quarantine immediately and  continue to quarantine throughout the isolation period of the person with COVID-19. °Continue to quarantine for an additional 5 days starting the day after the end of isolation for the person with COVID-19. °Get tested at least 5 days after the end of isolation of the infected person that lives with them. °If you test negative, you can leave the home but should continue to wear a well-fitting mask when around others at home and in public until 10 days after the end of isolation for the person with COVID-19. °Isolate immediately if you develop symptoms of COVID-19 or test positive. °If you are up to date with COVID-19 vaccines and have ongoing exposure to COVID-19, you should: °Get tested at least 5 days after your first exposure. A person with COVID-19 is considered infectious starting 2 days before they develop symptoms, or 2 days before the date of their positive test if they do not have symptoms. °Get tested again at least 5 days after the end of isolation for the person with COVID-19. °Wear a well-fitting mask when you are around the person with COVID-19, and do this throughout their isolation period. °Wear a well-fitting mask around others for 10 days after the infected person's isolation period ends. °Isolate immediately if you develop symptoms of COVID-19 or test positive. °What should I do if multiple people I live with test positive for COVID-19 at different times? °Recommendations for this situation depend on your vaccination status: °If you are not up to date with your COVID-19 vaccines, you should: °Quarantine throughout the isolation period of any infected person that you live with. °Continue to quarantine until 5 days after the end of isolation date for the most recently infected person that lives with you. For example, if   the last day of isolation of the person most recently infected with COVID-19 was June 30, the new 5-day quarantine period starts on July 1. °Get tested at least 5 days after the end  of isolation for the most recently infected person that lives with you. °Wear a well-fitting mask when you are around any person with COVID-19 while that person is in isolation. °Wear a well-fitting mask when you are around other people until 10 days after your last close contact. °Isolate immediately if you develop symptoms of COVID-19 or test positive. °If you are up to date with your COVID-19 vaccines, you should: °Get tested at least 5 days after your first exposure. A person with COVID-19 is considered infectious starting 2 days before they developed symptoms, or 2 days before the date of their positive test if they do not have symptoms. °Get tested again at least 5 days after the end of isolation for the most recently infected person that lives with you. °Wear a well-fitting mask when you are around any person with COVID-19 while that person is in isolation. °Wear a well-fitting mask around others for 10 days after the end of isolation for the most recently infected person that lives with you. For example, if the last day of isolation for the person most recently infected with COVID-19 was June 30, the new 10-day period to wear a well-fitting mask indoors in public starts on July 1. °Isolate immediately if you develop symptoms of COVID-19 or test positive. °I had COVID-19 and completed isolation. Do I have to quarantine or get tested if someone I live with gets COVID-19 shortly after I completed isolation? °No. If you recently completed isolation and someone that lives with you tests positive for the virus that causes COVID-19 shortly after the end of your isolation period, you do not have to quarantine or get tested as long as you do not develop new symptoms. Once all of the people that live together have completed isolation or quarantine, refer to the guidance below for new exposures to COVID-19. °If you had COVID-19 in the previous 90 days and then came into close contact with someone with COVID-19, you do  not have to quarantine or get tested if you do not have symptoms. But you should: °Wear a well-fitting mask indoors in public for 10 days after your last close contact. °Monitor for COVID-19 symptoms for 10 days from the date of your last close contact. °Isolate immediately and get tested if symptoms develop. °If more than 90 days have passed since your recovery from infection, follow CDC's recommendations for close contacts. These recommendations will differ depending on your vaccination status. °09/10/2020 °Content source: National Center for Immunization and Respiratory Diseases (NCIRD), Division of Viral Diseases °This information is not intended to replace advice given to you by your health care provider. Make sure you discuss any questions you have with your health care provider. °Document Revised: 01/14/2021 Document Reviewed: 01/14/2021 °Elsevier Patient Education © 2022 [employer]. ° °

## 2021-07-22 NOTE — Telephone Encounter (Signed)
Done

## 2021-07-23 LAB — VITAMIN D 1,25 DIHYDROXY
Vitamin D 1, 25 (OH)2 Total: 32 pg/mL (ref 18–72)
Vitamin D2 1, 25 (OH)2: <8 pg/mL
Vitamin D3 1, 25 (OH)2: 32 pg/mL

## 2021-07-24 NOTE — Telephone Encounter (Signed)
Patient moved out lab appointment to 03/27.

## 2021-07-27 ENCOUNTER — Encounter: Payer: 59 | Admitting: Family Medicine

## 2021-07-29 ENCOUNTER — Other Ambulatory Visit (HOSPITAL_COMMUNITY): Payer: Self-pay

## 2021-07-29 ENCOUNTER — Other Ambulatory Visit: Payer: Self-pay | Admitting: Family Medicine

## 2021-07-29 MED ORDER — ZOLPIDEM TARTRATE 10 MG PO TABS
10.0000 mg | ORAL_TABLET | Freq: Every evening | ORAL | 2 refills | Status: DC | PRN
  Filled 2021-07-29: qty 30, 30d supply, fill #0
  Filled 2021-09-16: qty 30, 30d supply, fill #1
  Filled 2021-10-19: qty 30, 30d supply, fill #2

## 2021-07-29 NOTE — Telephone Encounter (Signed)
Requesting: Ambien 10MG  Contract: None on file UDS: No recent UDS Last Visit: 01/23/21 Next Visit: 09/07/21 Last Refill: 06/11/21  Please Advise

## 2021-08-10 ENCOUNTER — Other Ambulatory Visit (HOSPITAL_BASED_OUTPATIENT_CLINIC_OR_DEPARTMENT_OTHER): Payer: Self-pay

## 2021-08-10 ENCOUNTER — Other Ambulatory Visit: Payer: 59

## 2021-08-11 ENCOUNTER — Ambulatory Visit: Payer: 59 | Admitting: Family Medicine

## 2021-08-11 ENCOUNTER — Other Ambulatory Visit (HOSPITAL_BASED_OUTPATIENT_CLINIC_OR_DEPARTMENT_OTHER): Payer: Self-pay

## 2021-08-11 ENCOUNTER — Encounter: Payer: Self-pay | Admitting: Family Medicine

## 2021-08-11 VITALS — BP 131/60 | HR 71 | Temp 98.2°F | Ht 64.0 in | Wt 135.4 lb

## 2021-08-11 DIAGNOSIS — L239 Allergic contact dermatitis, unspecified cause: Secondary | ICD-10-CM | POA: Diagnosis not present

## 2021-08-11 MED ORDER — LEVOCETIRIZINE DIHYDROCHLORIDE 5 MG PO TABS
5.0000 mg | ORAL_TABLET | Freq: Every evening | ORAL | 2 refills | Status: DC
  Filled 2021-08-11: qty 30, 30d supply, fill #0
  Filled 2021-12-07: qty 30, 30d supply, fill #1
  Filled 2022-05-26: qty 30, 30d supply, fill #2

## 2021-08-11 MED ORDER — TRIAMCINOLONE ACETONIDE 0.025 % EX OINT
1.0000 "application " | TOPICAL_OINTMENT | Freq: Two times a day (BID) | CUTANEOUS | 0 refills | Status: DC
  Filled 2021-08-11: qty 30, 15d supply, fill #0

## 2021-08-11 NOTE — Progress Notes (Signed)
Chief Complaint  Patient presents with   Rash    eyes    Shannon [name] is a 66 y.o. female here for a skin complaint.  Duration: a few days Location: both upper eyelids Pruritic? Yes Painful? No Drainage? No New soaps/lotions/topicals/detergents? No Other associated symptoms: scaling Therapies tried thus far: hypoallergenic lotions, Claritin  Past Medical History:  Diagnosis Date   History of chicken pox 08/08/2015   Hyperlipidemia, mild 06/17/2016   Insomnia related to another mental disorder 06/24/2017   Unspecified viral hepatitis C without hepatic coma 08/17/2015   Vitamin D deficiency 06/17/2016    BP 131/60    Pulse 71    Temp 98.2 F (36.8 C) (Oral)    Ht 5\' 4"  (1.626 m)    Wt 135 lb 6 oz (61.4 kg)    SpO2 98%    BMI 23.24 kg/m  Gen: awake, alert, appearing stated age Lungs: No accessory muscle use Skin: Small excoriated area on R upper lid without scaling. No drainage, erythema, TTP, fluctuance; no lesions noted on L upper lid; there is a cyst on the L parietal area of scalp; no other scalp lesions/scaling noted.  HEENT: nares patent w/o dc, ears patent, TM's neg b/l Psych: Age appropriate judgment and insight  Allergic dermatitis - Plan: levocetirizine (XYZAL) 5 MG tablet, triamcinolone (KENALOG) 0.025 % ointment  Will send in low potency Kenalog cream prn should it return. Use PO antihistamine if needed. Avoid scratching or using scented products if this does flare. Will see how things go prior to pursuing a referral.  F/u prn. The patient voiced understanding and agreement to the plan.  [place], DO 08/11/21 10:28 AM

## 2021-08-11 NOTE — Patient Instructions (Addendum)
Try not to scratch as this can make things worse. Avoid scented products while dealing with this. You may resume when the itchiness resolves. Cold/cool compresses can help.   Claritin (loratadine), Allegra (fexofenadine), Zyrtec (cetirizine) which is also equivalent to Xyzal (levocetirizine); these are listed in order from weakest to strongest. Generic, and therefore cheaper, options are in the parentheses.   There are available OTC, and the generic versions, which may be cheaper, are in parentheses. Show this to a pharmacist if you have trouble finding any of these items.  Artificial tears like Refresh and Systane may be used for comfort. OK to get generic version. Generally people use them every 2-4 hours, but you can use them as much as you want because there is no medication in it.  Let us know if you need anything.

## 2021-08-12 ENCOUNTER — Other Ambulatory Visit (HOSPITAL_BASED_OUTPATIENT_CLINIC_OR_DEPARTMENT_OTHER): Payer: Self-pay

## 2021-08-12 MED ORDER — ALPRAZOLAM 0.25 MG PO TABS
0.2500 mg | ORAL_TABLET | ORAL | 0 refills | Status: DC
Start: 2021-08-11 — End: 2021-09-25
  Filled 2021-08-12: qty 6, 3d supply, fill #0

## 2021-09-07 ENCOUNTER — Other Ambulatory Visit (INDEPENDENT_AMBULATORY_CARE_PROVIDER_SITE_OTHER): Payer: 59

## 2021-09-07 ENCOUNTER — Other Ambulatory Visit (HOSPITAL_BASED_OUTPATIENT_CLINIC_OR_DEPARTMENT_OTHER): Payer: Self-pay

## 2021-09-07 DIAGNOSIS — D649 Anemia, unspecified: Secondary | ICD-10-CM | POA: Diagnosis not present

## 2021-09-07 LAB — CBC WITH DIFFERENTIAL/PLATELET
Basophils Absolute: 0 10*3/uL (ref 0.0–0.1)
Basophils Relative: 1.2 % (ref 0.0–3.0)
Eosinophils Absolute: 0.1 10*3/uL (ref 0.0–0.7)
Eosinophils Relative: 1.7 % (ref 0.0–5.0)
HCT: 35.9 % — ABNORMAL LOW (ref 36.0–46.0)
Hemoglobin: 12.2 g/dL (ref 12.0–15.0)
Lymphocytes Relative: 36.6 % (ref 12.0–46.0)
Lymphs Abs: 1.3 10*3/uL (ref 0.7–4.0)
MCHC: 34.1 g/dL (ref 30.0–36.0)
MCV: 96.7 fl (ref 78.0–100.0)
Monocytes Absolute: 0.3 10*3/uL (ref 0.1–1.0)
Monocytes Relative: 8.6 % (ref 3.0–12.0)
Neutro Abs: 1.8 10*3/uL (ref 1.4–7.7)
Neutrophils Relative %: 51.9 % (ref 43.0–77.0)
Platelets: 122 10*3/uL — ABNORMAL LOW (ref 150.0–400.0)
RBC: 3.71 Mil/uL — ABNORMAL LOW (ref 3.87–5.11)
RDW: 13.8 % (ref 11.5–15.5)
WBC: 3.5 10*3/uL — ABNORMAL LOW (ref 4.0–10.5)

## 2021-09-09 ENCOUNTER — Other Ambulatory Visit (HOSPITAL_BASED_OUTPATIENT_CLINIC_OR_DEPARTMENT_OTHER): Payer: Self-pay
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
## 2021-09-14 ENCOUNTER — Encounter: Payer: 59 | Admitting: Family Medicine

## 2021-09-16 ENCOUNTER — Other Ambulatory Visit (HOSPITAL_COMMUNITY): Payer: Self-pay
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
## 2021-09-25 ENCOUNTER — Ambulatory Visit (INDEPENDENT_AMBULATORY_CARE_PROVIDER_SITE_OTHER): Payer: 59 | Admitting: Family Medicine

## 2021-09-25 ENCOUNTER — Encounter: Payer: Self-pay | Admitting: Family Medicine

## 2021-09-25 ENCOUNTER — Other Ambulatory Visit (HOSPITAL_BASED_OUTPATIENT_CLINIC_OR_DEPARTMENT_OTHER): Payer: Self-pay

## 2021-09-25 VITALS — BP 124/80 | HR 65 | Resp 20 | Ht 64.0 in | Wt 135.6 lb

## 2021-09-25 DIAGNOSIS — E559 Vitamin D deficiency, unspecified: Secondary | ICD-10-CM

## 2021-09-25 DIAGNOSIS — Z0000 Encounter for general adult medical examination without abnormal findings: Secondary | ICD-10-CM | POA: Diagnosis not present

## 2021-09-25 DIAGNOSIS — Z79899 Other long term (current) drug therapy: Secondary | ICD-10-CM | POA: Diagnosis not present

## 2021-09-25 DIAGNOSIS — Z1211 Encounter for screening for malignant neoplasm of colon: Secondary | ICD-10-CM

## 2021-09-25 DIAGNOSIS — Z8616 Personal history of COVID-19: Secondary | ICD-10-CM

## 2021-09-25 DIAGNOSIS — E785 Hyperlipidemia, unspecified: Secondary | ICD-10-CM | POA: Diagnosis not present

## 2021-09-25 DIAGNOSIS — D696 Thrombocytopenia, unspecified: Secondary | ICD-10-CM | POA: Diagnosis not present

## 2021-09-25 MED ORDER — ALPRAZOLAM 0.25 MG PO TABS
0.2500 mg | ORAL_TABLET | Freq: Three times a day (TID) | ORAL | 0 refills | Status: DC | PRN
  Filled 2021-09-25: qty 20, 7d supply, fill #0

## 2021-09-25 NOTE — Assessment & Plan Note (Signed)
Notes an increase in fatigue which worsened after her second infection in November, she also notes a change in her voice, she is hoping it will recover.  ?

## 2021-09-25 NOTE — Assessment & Plan Note (Signed)
Supplement and monitor 

## 2021-09-25 NOTE — Patient Instructions (Addendum)
L Tryptophan ? ?Shingrix is the new shingles shot, 2 shots over 2-6 months, confirm coverage with insurance and document, then can return here for shots with nurse appt or at pharmacy  ? ?Prevnar 20  ? ? ?Preventive Care 62 Years and Older, Female ?Preventive care refers to lifestyle choices and visits with your health care provider that can promote health and wellness. Preventive care visits are also called wellness exams. ?What can I expect for my preventive care visit? ?Counseling ?Your health care provider may ask you questions about your: ?Medical history, including: ?Past medical problems. ?Family medical history. ?Pregnancy and menstrual history. ?History of falls. ?Current health, including: ?Memory and ability to understand (cognition). ?Emotional well-being. ?Home life and relationship well-being. ?Sexual activity and sexual health. ?Lifestyle, including: ?Alcohol, nicotine or tobacco, and drug use. ?Access to firearms. ?Diet, exercise, and sleep habits. ?Work and work Statistician. ?Sunscreen use. ?Safety issues such as seatbelt and bike helmet use. ?Physical exam ?Your health care provider will check your: ?Height and weight. These may be used to calculate your BMI (body mass index). BMI is a measurement that tells if you are at a healthy weight. ?Waist circumference. This measures the distance around your waistline. This measurement also tells if you are at a healthy weight and may help predict your risk of certain diseases, such as type 2 diabetes and high blood pressure. ?Heart rate and blood pressure. ?Body temperature. ?Skin for abnormal spots. ?What immunizations do I need? ? ?Vaccines are usually given at various ages, according to a schedule. Your health care provider will recommend vaccines for you based on your age, medical history, and lifestyle or other factors, such as travel or where you work. ?What tests do I need? ?Screening ?Your health care provider may recommend screening tests for  certain conditions. This may include: ?Lipid and cholesterol levels. ?Hepatitis C test. ?Hepatitis B test. ?HIV (human immunodeficiency virus) test. ?STI (sexually transmitted infection) testing, if you are at risk. ?Lung cancer screening. ?Colorectal cancer screening. ?Diabetes screening. This is done by checking your blood sugar (glucose) after you have not eaten for a while (fasting). ?Mammogram. Talk with your health care provider about how often you should have regular mammograms. ?BRCA-related cancer screening. This may be done if you have a family history of breast, ovarian, tubal, or peritoneal cancers. ?Bone density scan. This is done to screen for osteoporosis. ?Talk with your health care provider about your test results, treatment options, and if necessary, the need for more tests. ?Follow these instructions at home: ?Eating and drinking ? ?Eat a diet that includes fresh fruits and vegetables, whole grains, lean protein, and low-fat dairy products. Limit your intake of foods with high amounts of sugar, saturated fats, and salt. ?Take vitamin and mineral supplements as recommended by your health care provider. ?Do not drink alcohol if your health care provider tells you not to drink. ?If you drink alcohol: ?Limit how much you have to 0-1 drink a day. ?Know how much alcohol is in your drink. In the [place] one drink equals one 12 oz bottle of beer (355 mL), one 5 oz glass of wine (148 mL), or one 1? oz glass of hard liquor (44 mL). ?Lifestyle ?Brush your teeth every morning and night with fluoride toothpaste. Floss one time each day. ?Exercise for at least 30 minutes 5 or more days each week. ?Do not use any products that contain nicotine or tobacco. These products include cigarettes, chewing tobacco, and vaping devices, such as e-cigarettes. If you  need help quitting, ask your health care provider. ?Do not use drugs. ?If you are sexually active, practice safe sex. Use a condom or other form of protection in  order to prevent STIs. ?Take aspirin only as told by your health care provider. Make sure that you understand how much to take and what form to take. Work with your health care provider to find out whether it is safe and beneficial for you to take aspirin daily. ?Ask your health care provider if you need to take a cholesterol-lowering medicine (statin). ?Find healthy ways to manage stress, such as: ?Meditation, yoga, or listening to music. ?Journaling. ?Talking to a trusted person. ?Spending time with friends and family. ?Minimize exposure to UV radiation to reduce your risk of skin cancer. ?Safety ?Always wear your seat belt while driving or riding in a vehicle. ?Do not drive: ?If you have been drinking alcohol. Do not ride with someone who has been drinking. ?When you are tired or distracted. ?While texting. ?If you have been using any mind-altering substances or drugs. ?Wear a helmet and other protective equipment during sports activities. ?If you have firearms in your house, make sure you follow all gun safety procedures. ?What's next? ?Visit your health care provider once a year for an annual wellness visit. ?Ask your health care provider how often you should have your eyes and teeth checked. ?Stay up to date on all vaccines. ?This information is not intended to replace advice given to you by your health care provider. Make sure you discuss any questions you have with your health care provider. ?Document Revised: 11/26/2020 Document Reviewed: 11/26/2020 ?Elsevier Patient Education ? [place]. ? ?

## 2021-09-25 NOTE — Assessment & Plan Note (Signed)
Encourage heart healthy diet such as MIND or DASH diet, increase exercise, avoid trans fats, simple carbohydrates and processed foods, consider a krill or fish or flaxseed oil cap daily.  °

## 2021-09-25 NOTE — Assessment & Plan Note (Addendum)
Patient encouraged to maintain heart healthy diet, regular exercise, adequate sleep. Consider daily probiotics. Take medications as prescribed. Labs reviewed. Colonoscopy 2013 due this year, referred to [facility]. MGM October 2022 due in 2023. Dexa repeat in 2024. Pap 10/22 repeat in 3-5 years. Shingrix is the new shingles shot, 2 shots over 2-6 months, confirm coverage with insurance and document, then can return here for shots with nurse appt or at pharmacy. Encouraged Prevnar 20 she agrees to take it at a later date ?

## 2021-10-19 ENCOUNTER — Other Ambulatory Visit (HOSPITAL_BASED_OUTPATIENT_CLINIC_OR_DEPARTMENT_OTHER): Payer: Self-pay

## 2021-12-07 ENCOUNTER — Other Ambulatory Visit (HOSPITAL_BASED_OUTPATIENT_CLINIC_OR_DEPARTMENT_OTHER): Payer: Self-pay

## 2021-12-15 IMAGING — US US PELVIS COMPLETE WITH TRANSVAGINAL
1 series · 13 of 25 positions shown · non-contrast
Comparison: None; correlation: Lumbar spine radiographs 08/18/2020

CLINICAL DATA: Long-term hormone replacement therapy,
postmenopausal bleeding



[Series 1: us pelvic complete with transvaginal · 13 of 70 slices shown]
[im 1/70]
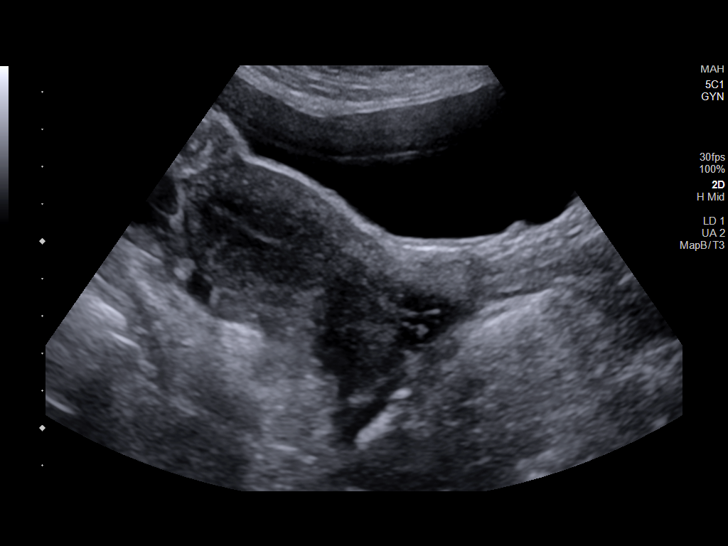
[im 6/70]
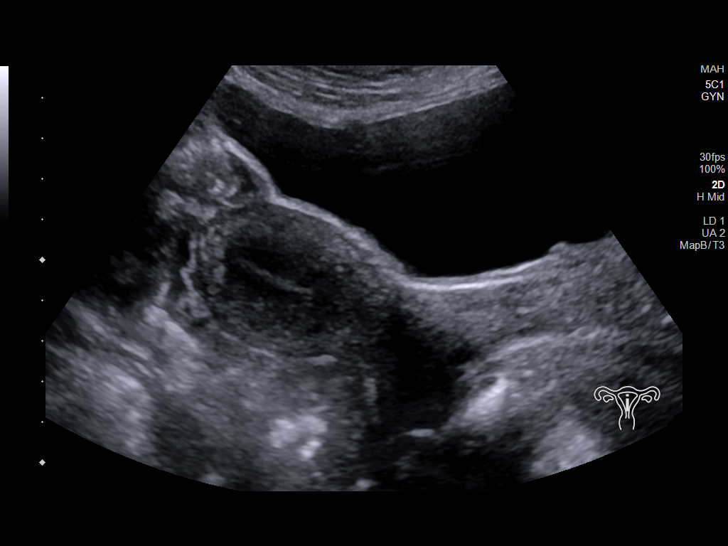
[im 12/70]
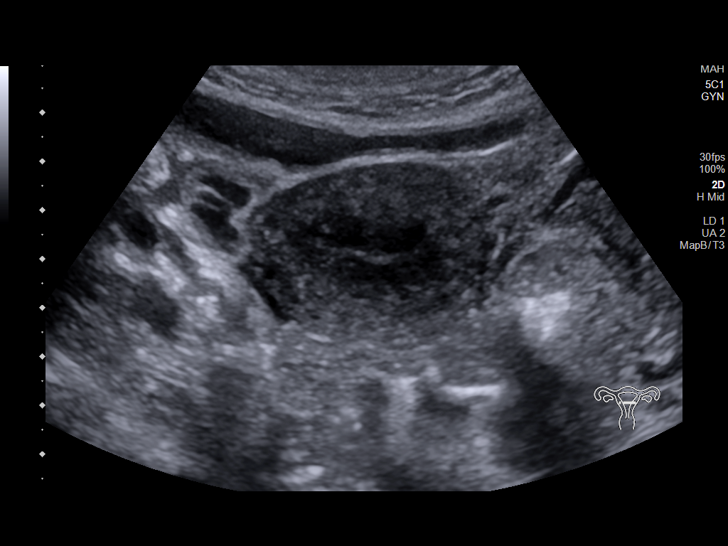
[im 18/70]
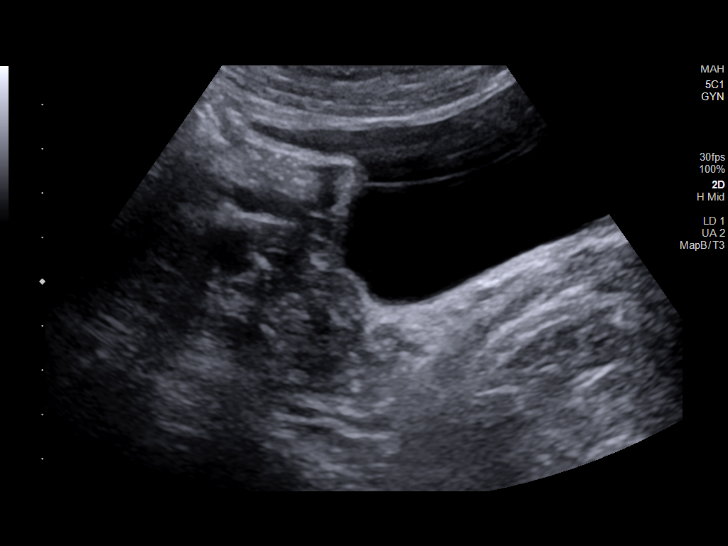
[im 24/70]
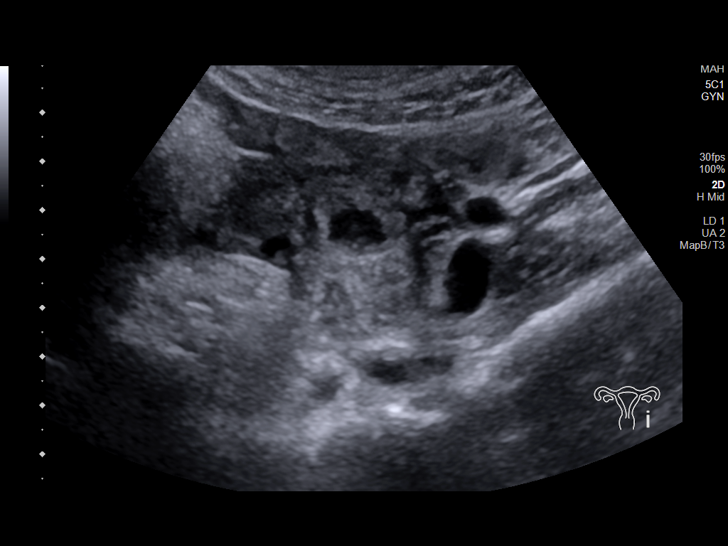
[im 29/70]
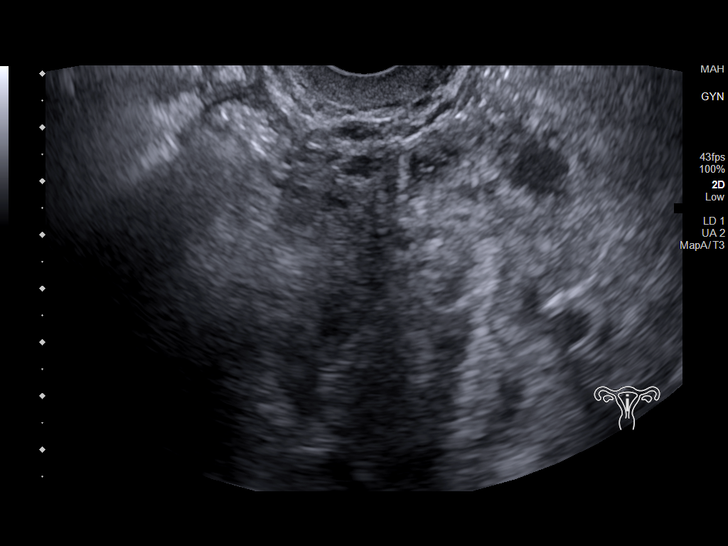
[im 35/70]
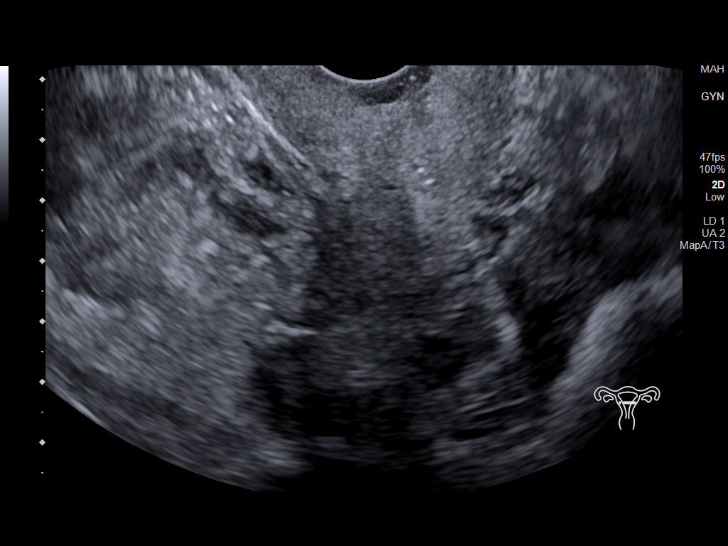
[im 41/70]
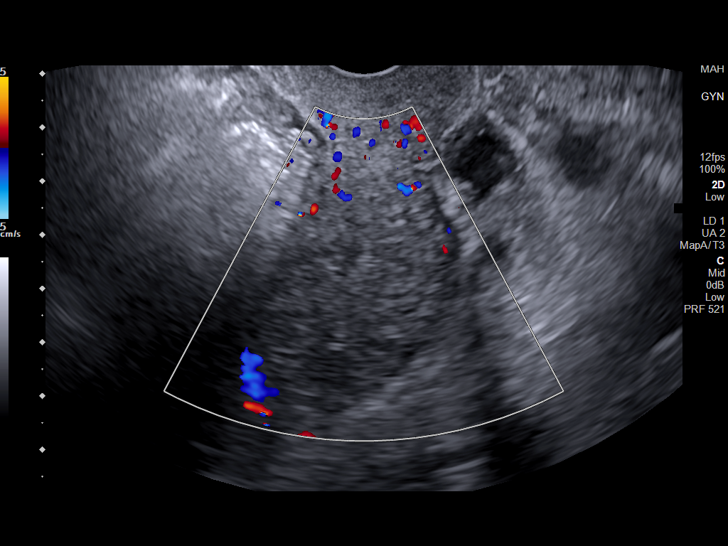
[im 47/70]
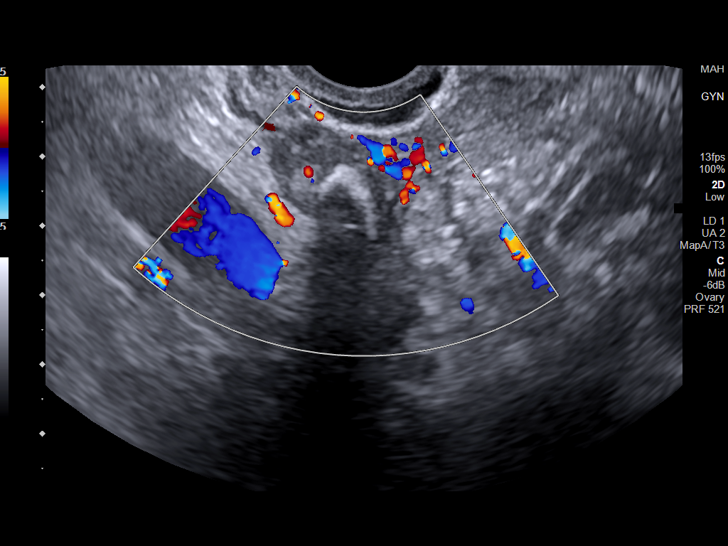
[im 52/70]
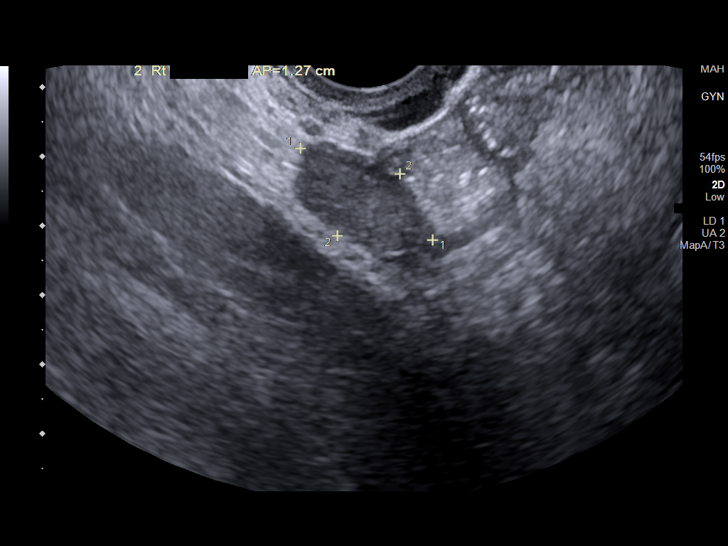
[im 58/70]
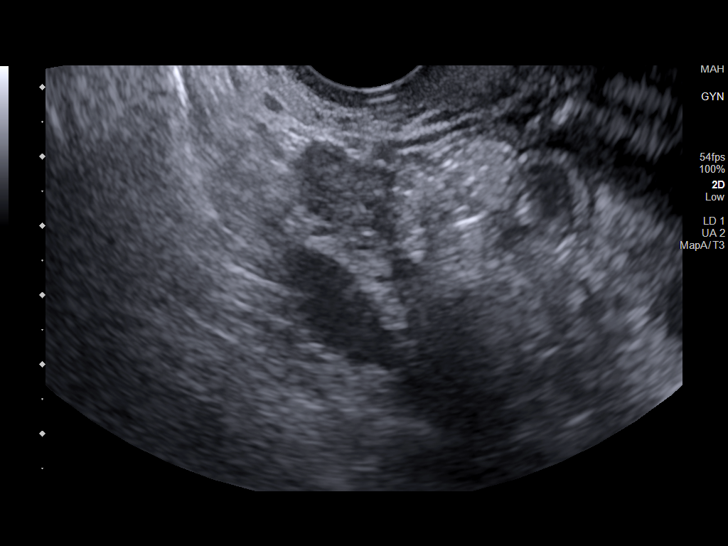
[im 64/70]
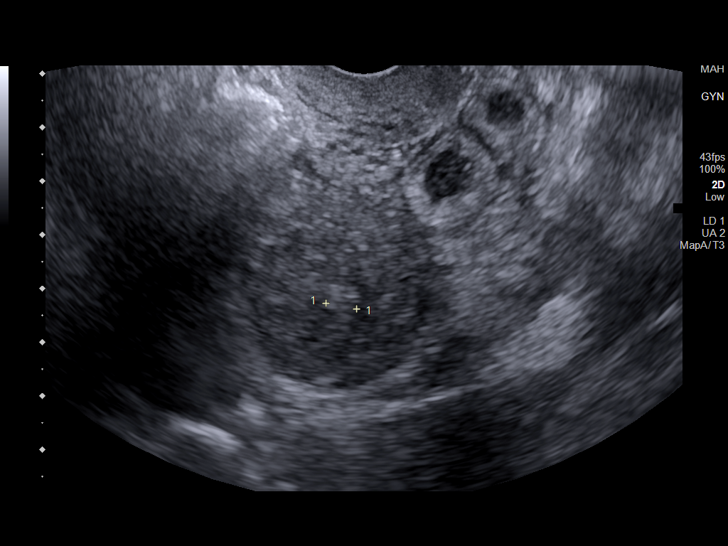
[im 70/70]
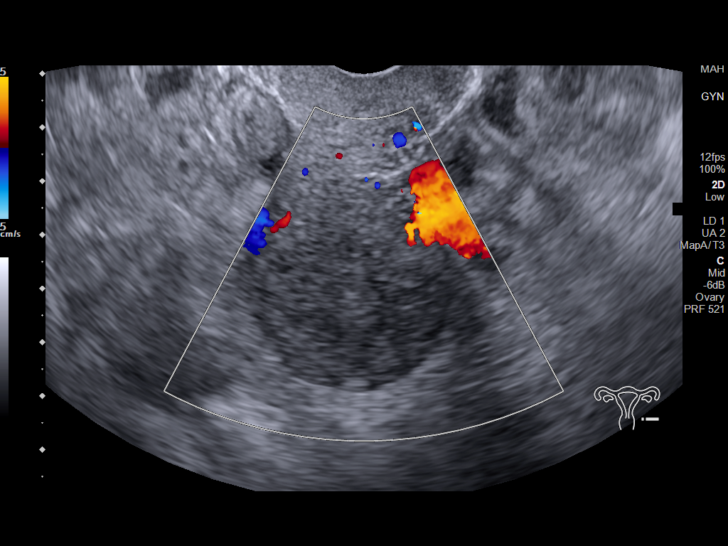

[13 of 25 positions shown; findings below may reference images not displayed]

FINDINGS: Uterus

Measurements: 8.3 x 4.3 x 4.7 cm = volume: 89 mL. Slightly
retroverted. Normal morphology without mass

Endometrium

Thickness: Not adequately identified on transvaginal imaging. On
transabdominal imaging, endometrial complex measures 2 mm thick,
normal. No endometrial fluid

Right ovary

Measurements: 2.3 x 1.3 x 2.2 cm = volume: 3.3 mL. Shadowing
calcification within RIGHT ovary 12 mm diameter, corresponding to a
RIGHT pelvic calcification identified on prior lumbar spine
radiographs. No mass.

Left ovary

Not visualized, likely obscured by bowel

Other findings

No free pelvic fluid.  No adnexal masses.
IMPRESSION: Nonspecific calcification within RIGHT ovary 12 mm diameter.

Nonvisualization of LEFT ovary.

Otherwise normal exam, with endometrial complex normal in appearance
2 mm thick; in the setting of post-menopausal bleeding, this is
consistent with a benign etiology such as endometrial atrophy. If
bleeding remains unresponsive to hormonal or medical therapy,
sonohysterogram should be considered for focal lesion work-up. (Ref:
Radiological Reasoning: Algorithmic Workup of Abnormal Vaginal
Bleeding with Endovaginal Sonography and Sonohysterography. AJR
[id number]; 191:S68-73)

## 2021-12-22 ENCOUNTER — Other Ambulatory Visit: Payer: Self-pay | Admitting: Family Medicine

## 2021-12-23 ENCOUNTER — Other Ambulatory Visit (HOSPITAL_BASED_OUTPATIENT_CLINIC_OR_DEPARTMENT_OTHER): Payer: Self-pay

## 2021-12-23 MED ORDER — ZOLPIDEM TARTRATE 10 MG PO TABS
10.0000 mg | ORAL_TABLET | Freq: Every evening | ORAL | 2 refills | Status: DC | PRN
Start: 2021-12-23 — End: 2022-03-04
  Filled 2021-12-23: qty 30, 30d supply, fill #0
  Filled 2022-02-02: qty 30, 30d supply, fill #1

## 2021-12-23 NOTE — Telephone Encounter (Signed)
Requesting: Ambien '10mg'$   Contract: 09/25/21 UDS: None Last Visit: 09/25/21 Next Visit: None Last Refill: 07/29/21 #30 and 0RF  Please Advise

## 2022-01-01 ENCOUNTER — Encounter: Payer: Self-pay | Admitting: Gastroenterology

## 2022-01-26 ENCOUNTER — Ambulatory Visit (AMBULATORY_SURGERY_CENTER): Payer: PPO | Admitting: *Deleted

## 2022-01-26 ENCOUNTER — Other Ambulatory Visit (HOSPITAL_BASED_OUTPATIENT_CLINIC_OR_DEPARTMENT_OTHER): Payer: Self-pay

## 2022-01-26 VITALS — Ht 64.0 in | Wt 133.0 lb

## 2022-01-26 DIAGNOSIS — Z1211 Encounter for screening for malignant neoplasm of colon: Secondary | ICD-10-CM

## 2022-01-26 MED ORDER — NA SULFATE-K SULFATE-MG SULF 17.5-3.13-1.6 GM/177ML PO SOLN
2.0000 | Freq: Once | ORAL | 0 refills | Status: AC
  Filled 2022-01-26: qty 354, 1d supply, fill #0

## 2022-01-26 NOTE — Progress Notes (Signed)
No egg or soy allergy known to patient  No issues known to pt with past sedation with any surgeries or procedures Patient denies ever being told they had issues or difficulty with intubation  No FH of Malignant Hyperthermia Pt is not on diet pills Pt is not on  home 02  Pt is not on blood thinners  Pt denies issues with constipation  No A fib or A flutter   Discussed with pt there will be an out-of-pocket cost for prep and that varies from $0 to 70 +  dollars - pt verbalized understanding  Pt instructed to use Singlecare.com or GoodRx for a price reduction on prep   PV completed in person. Pt verified name, DOB.  Procedure explained to pt. Prep instructions reviewed, questions answered. Pt encouraged to call with questions or issues.  If pt has My chart, procedure instructions sent via My Chart    
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
## 2022-01-28 ENCOUNTER — Ambulatory Visit: Payer: PPO | Admitting: Family Medicine

## 2022-01-28 ENCOUNTER — Ambulatory Visit (INDEPENDENT_AMBULATORY_CARE_PROVIDER_SITE_OTHER): Payer: PPO

## 2022-01-28 ENCOUNTER — Ambulatory Visit: Payer: Self-pay

## 2022-01-28 ENCOUNTER — Encounter: Payer: Self-pay | Admitting: Family Medicine

## 2022-01-28 VITALS — BP 142/80 | HR 64 | Ht 64.0 in | Wt 135.0 lb

## 2022-01-28 DIAGNOSIS — M12812 Other specific arthropathies, not elsewhere classified, left shoulder: Secondary | ICD-10-CM | POA: Insufficient documentation

## 2022-01-28 DIAGNOSIS — M1710 Unilateral primary osteoarthritis, unspecified knee: Secondary | ICD-10-CM | POA: Diagnosis not present

## 2022-01-28 DIAGNOSIS — M2550 Pain in unspecified joint: Secondary | ICD-10-CM | POA: Diagnosis not present

## 2022-01-28 DIAGNOSIS — E785 Hyperlipidemia, unspecified: Secondary | ICD-10-CM | POA: Diagnosis not present

## 2022-01-28 DIAGNOSIS — M25561 Pain in right knee: Secondary | ICD-10-CM

## 2022-01-28 DIAGNOSIS — M25512 Pain in left shoulder: Secondary | ICD-10-CM

## 2022-01-28 DIAGNOSIS — M25562 Pain in left knee: Secondary | ICD-10-CM

## 2022-01-28 LAB — CBC WITH DIFFERENTIAL/PLATELET
Basophils Absolute: 0.1 10*3/uL (ref 0.0–0.1)
Basophils Relative: 1.4 % (ref 0.0–3.0)
Eosinophils Absolute: 0 10*3/uL (ref 0.0–0.7)
Eosinophils Relative: 1.2 % (ref 0.0–5.0)
HCT: 37.5 % (ref 36.0–46.0)
Hemoglobin: 12.9 g/dL (ref 12.0–15.0)
Lymphocytes Relative: 33.6 % (ref 12.0–46.0)
Lymphs Abs: 1.3 10*3/uL (ref 0.7–4.0)
MCHC: 34.3 g/dL (ref 30.0–36.0)
MCV: 95.9 fl (ref 78.0–100.0)
Monocytes Absolute: 0.3 10*3/uL (ref 0.1–1.0)
Monocytes Relative: 8.1 % (ref 3.0–12.0)
Neutro Abs: 2.1 10*3/uL (ref 1.4–7.7)
Neutrophils Relative %: 55.7 % (ref 43.0–77.0)
Platelets: 131 10*3/uL — ABNORMAL LOW (ref 150.0–400.0)
RBC: 3.91 Mil/uL (ref 3.87–5.11)
RDW: 13.3 % (ref 11.5–15.5)
WBC: 3.7 10*3/uL — ABNORMAL LOW (ref 4.0–10.5)

## 2022-01-28 LAB — IBC PANEL
Iron: 91 ug/dL (ref 42–145)
Saturation Ratios: 24.7 % (ref 20.0–50.0)
TIBC: 368.2 ug/dL (ref 250.0–450.0)
Transferrin: 263 mg/dL (ref 212.0–360.0)

## 2022-01-28 LAB — COMPREHENSIVE METABOLIC PANEL
ALT: 20 U/L (ref 0–35)
AST: 25 U/L (ref 0–37)
Albumin: 4.5 g/dL (ref 3.5–5.2)
Alkaline Phosphatase: 29 U/L — ABNORMAL LOW (ref 39–117)
BUN: 13 mg/dL (ref 6–23)
CO2: 29 mEq/L (ref 19–32)
Calcium: 9.4 mg/dL (ref 8.4–10.5)
Chloride: 101 mEq/L (ref 96–112)
Creatinine, Ser: 0.72 mg/dL (ref 0.40–1.20)
GFR: 86.51 mL/min (ref >60.00)
Glucose, Bld: 89 mg/dL (ref 70–99)
Potassium: 3.8 mEq/L (ref 3.5–5.1)
Sodium: 137 mEq/L (ref 135–145)
Total Bilirubin: 0.6 mg/dL (ref 0.2–1.2)
Total Protein: 7.3 g/dL (ref 6.0–8.3)

## 2022-01-28 LAB — FERRITIN: Ferritin: 81.6 ng/mL (ref 10.0–291.0)

## 2022-01-28 LAB — VITAMIN D 25 HYDROXY (VIT D DEFICIENCY, FRACTURES): VITD: 37.22 ng/mL (ref 30.00–100.00)

## 2022-01-28 LAB — URIC ACID: Uric Acid, Serum: 2.6 mg/dL (ref 2.4–7.0)

## 2022-01-28 LAB — C-REACTIVE PROTEIN: CRP: <1 mg/dL (ref 0.5–20.0)

## 2022-01-28 LAB — SEDIMENTATION RATE: Sed Rate: 9 mm/hr (ref 0–30)

## 2022-01-28 LAB — TSH: TSH: 1.6 u[IU]/mL (ref 0.35–5.50)

## 2022-01-28 NOTE — Assessment & Plan Note (Signed)
Patellofemoral arthritis of the knees bilaterally.  We will get x-rays to further evaluate the amount of arthritic changes that could be there.  Concerned that the patient does have underlying autoimmune disease secondary to the severity of the arthritic changes.  We will see if anything comes back otherwise.  Discussed which activities to do which ones to avoid.  Patient we discussed the possibility of a Tru pull lite brace.  Patient wants to try conservative therapy with her vastus medialis oblique strengthening.  We will follow-up with me again in 6 weeks.  Worsening pain consider formal physical therapy and injections.

## 2022-01-28 NOTE — Assessment & Plan Note (Signed)
She has had worsening arthritic changes of the shoulder noted.  Some hypoechoic changes on ultrasound that does make me concerned for the instability of the shoulder.  Patient would not want any surgical intervention and wants to hold on any type of injections as well.  We discussed we could consider some medications but I think we will be mild improvement.  Patient wants to hold on that as well at the moment.  Follow-up with me again in 6 to 8 weeks.  Worsening pain consider injection and formal physical therapy.  Secondary to the severity of arthritic changes we are getting laboratory work-up.

## 2022-01-28 NOTE — Patient Instructions (Addendum)
Ice Voltaren Less weight and more reps 15-20 reps when working out [employer] exercises Xray today B standing L shoulder Labs See me back 6 weeks

## 2022-02-02 ENCOUNTER — Other Ambulatory Visit (HOSPITAL_BASED_OUTPATIENT_CLINIC_OR_DEPARTMENT_OTHER): Payer: Self-pay

## 2022-02-03 LAB — CALCIUM, IONIZED: Calcium, Ion: 5 mg/dL (ref 4.7–5.5)

## 2022-02-03 LAB — RHEUMATOID FACTOR: Rheumatoid fact SerPl-aCnc: <14 IU/mL (ref <14)

## 2022-02-03 LAB — PTH, INTACT AND CALCIUM
Calcium: 9.6 mg/dL (ref 8.6–10.4)
PTH: 24 pg/mL (ref 16–77)

## 2022-02-03 LAB — CYCLIC CITRUL PEPTIDE ANTIBODY, IGG: Cyclic Citrullin Peptide Ab: <16 UNITS

## 2022-02-03 LAB — ANA: Anti Nuclear Antibody (ANA): NEGATIVE

## 2022-02-03 LAB — ANGIOTENSIN CONVERTING ENZYME: Angiotensin-Converting Enzyme: 27 U/L (ref 9–67)

## 2022-02-04 ENCOUNTER — Encounter: Payer: Self-pay | Admitting: Gastroenterology

## 2022-02-16 ENCOUNTER — Encounter: Payer: Self-pay | Admitting: Gastroenterology

## 2022-02-16 ENCOUNTER — Ambulatory Visit (AMBULATORY_SURGERY_CENTER): Payer: PPO | Admitting: Gastroenterology

## 2022-02-16 VITALS — BP 136/70 | HR 56 | Temp 98.2°F | Resp 12 | Ht 64.0 in | Wt 133.0 lb

## 2022-02-16 DIAGNOSIS — Z1211 Encounter for screening for malignant neoplasm of colon: Secondary | ICD-10-CM | POA: Diagnosis not present

## 2022-02-16 DIAGNOSIS — Z86010 Personal history of colonic polyps: Secondary | ICD-10-CM

## 2022-02-16 DIAGNOSIS — D125 Benign neoplasm of sigmoid colon: Secondary | ICD-10-CM | POA: Diagnosis not present

## 2022-02-16 DIAGNOSIS — Z09 Encounter for follow-up examination after completed treatment for conditions other than malignant neoplasm: Secondary | ICD-10-CM | POA: Diagnosis not present

## 2022-02-16 DIAGNOSIS — D123 Benign neoplasm of transverse colon: Secondary | ICD-10-CM

## 2022-02-16 DIAGNOSIS — K635 Polyp of colon: Secondary | ICD-10-CM

## 2022-02-16 MED ORDER — SODIUM CHLORIDE 0.9 % IV SOLN: 500.0000 mL | Freq: Once | INTRAVENOUS | Status: DC

## 2022-02-16 NOTE — Progress Notes (Signed)
To pacu, VSS. Report to [email or web address] 

## 2022-02-16 NOTE — Progress Notes (Signed)
Called to room to assist during endoscopic procedure.  Patient ID and intended procedure confirmed with present staff. Received instructions for my participation in the procedure from the performing physician.  

## 2022-02-16 NOTE — Op Note (Signed)
[facility] Patient Name: Shannon [name] Procedure Date: 02/16/2022 7:26 AM MRN: [id number] Endoscopist: Thornton Park MD, MD Age: 66 Referring MD:  Date of Birth: 02/20/1956 Gender: Female Account #: [id number] Procedure:                Colonoscopy Indications:              Screening for colorectal malignant neoplasm                           Last colonoscopy 2013 at [facility]                           She reports a polyp on a colonoscopy prior to 2013                           No known family history of colon cancer or polyps Medicines:                Monitored Anesthesia Care Procedure:                Pre-Anesthesia Assessment:                           - Prior to the procedure, a History and Physical                            was performed, and patient medications and                            allergies were reviewed. The patient's tolerance of                            previous anesthesia was also reviewed. The risks                            and benefits of the procedure and the sedation                            options and risks were discussed with the patient.                            All questions were answered, and informed consent                            was obtained. Prior Anticoagulants: The patient has                            taken no previous anticoagulant or antiplatelet                            agents. ASA Grade Assessment: II - A patient with                            mild systemic disease. After reviewing the risks  and benefits, the patient was deemed in                            satisfactory condition to undergo the procedure.                           After obtaining informed consent, the colonoscope                            was passed under direct vision. Throughout the                            procedure, the patient's blood pressure, pulse, and                            oxygen saturations were monitored  continuously. The                            Colonoscope was introduced through the anus and                            advanced to the the cecum, identified by                            appendiceal orifice and ileocecal valve. A second                            forward view of the right colon was performed. The                            colonoscopy was performed with moderate difficulty                            due to significant looping and a tortuous colon.                            Successful completion of the procedure was aided by                            changing the patient's position, withdrawing and                            reinserting the scope and applying abdominal                            pressure. The patient tolerated the procedure well.                            The quality of the bowel preparation was good                            although over 1551m of gritty residual liquid was  present throughout the colon. The ileocecal valve,                            appendiceal orifice, and rectum were photographed. Scope In: 8:11:57 AM Scope Out: 8:35:58 AM Scope Withdrawal Time: 0 hours 17 minutes 0 seconds  Total Procedure Duration: 0 hours 24 minutes 1 second  Findings:                 The perianal and digital rectal examinations were                            normal.                           A 6 mm polyp was found in the hepatic flexure. The                            polyp was flat. The polyp was removed with a cold                            snare. Resection and retrieval were complete.                            Estimated blood loss was minimal.                           A 3 mm polyp was found in the transverse colon. The                            polyp was flat. The polyp was removed with a cold                            snare. Resection and retrieval were complete.                            Estimated blood loss was  minimal.                           A 3 mm polyp was found in the sigmoid colon. The                            polyp was flat. The polyp was removed with a cold                            snare. Resection and retrieval were complete.                            Estimated blood loss was minimal.                           The exam was otherwise without abnormality on                            direct and  retroflexion views. Complications:            No immediate complications. Estimated Blood Loss:     Estimated blood loss was minimal. Impression:               - One 6 mm polyp at the hepatic flexure, removed                            with a cold snare. Resected and retrieved.                           - One 3 mm polyp in the transverse colon, removed                            with a cold snare. Resected and retrieved.                           - One 3 mm polyp in the sigmoid colon, removed with                            a cold snare. Resected and retrieved.                           - The examination was otherwise normal on direct                            and retroflexion views. Recommendation:           - Patient has a contact number available for                            emergencies. The signs and symptoms of potential                            delayed complications were discussed with the                            patient. Return to normal activities tomorrow.                            Written discharge instructions were provided to the                            patient.                           - Resume previous diet.                           - Continue present medications.                           - Await pathology results.                           - Repeat colonoscopy date to be determined after  pending pathology results are reviewed for                            surveillance. Plan a two day bowel prep at that                             time.                           - Emerging evidence supports eating a diet of                            fruits, vegetables, grains, calcium, and yogurt                            while reducing red meat and alcohol may reduce the                            risk of colon cancer.                           - Thank you for allowing me to be involved in your                            colon cancer prevention. Thornton Park MD, MD 02/16/2022 8:43:09 AM This report has been signed electronically.
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
## 2022-02-16 NOTE — Progress Notes (Signed)
Pt's states no medical or surgical changes since previsit or office visit. 

## 2022-02-16 NOTE — Patient Instructions (Signed)
YOU HAD AN ENDOSCOPIC PROCEDURE TODAY AT THE  ENDOSCOPY CENTER:   Refer to the procedure report that was given to you for any specific questions about what was found during the examination.  If the procedure report does not answer your questions, please call your gastroenterologist to clarify.  If you requested that your care partner not be given the details of your procedure findings, then the procedure report has been included in a sealed envelope for you to review at your convenience later.  YOU SHOULD EXPECT: Some feelings of bloating in the abdomen. Passage of more gas than usual.  Walking can help get rid of the air that was put into your GI tract during the procedure and reduce the bloating. If you had a lower endoscopy (such as a colonoscopy or flexible sigmoidoscopy) you may notice spotting of blood in your stool or on the toilet paper. If you underwent a bowel prep for your procedure, you may not have a normal bowel movement for a few days.  Please Note:  You might notice some irritation and congestion in your nose or some drainage.  This is from the oxygen used during your procedure.  There is no need for concern and it should clear up in a day or so.  SYMPTOMS TO REPORT IMMEDIATELY:  Following lower endoscopy (colonoscopy or flexible sigmoidoscopy):  Excessive amounts of blood in the stool  Significant tenderness or worsening of abdominal pains  Swelling of the abdomen that is new, acute  Fever of 100F or higher  For urgent or emergent issues, a gastroenterologist can be reached at any hour by calling [phone]. Do not use MyChart messaging for urgent concerns.    DIET:  We do recommend a small meal at first, but then you may proceed to your regular diet.  Drink plenty of fluids but you should avoid alcoholic beverages for 24 hours.  ACTIVITY:  You should plan to take it easy for the rest of today and you should NOT DRIVE or use heavy machinery until tomorrow (because of  the sedation medicines used during the test).    FOLLOW UP: Our staff will call the number listed on your records the next business day following your procedure.  We will call around 7:15- 8:00 am to check on you and address any questions or concerns that you may have regarding the information given to you following your procedure. If we do not reach you, we will leave a message.  If you develop any symptoms (ie: fever, flu-like symptoms, shortness of breath, cough etc.) before then, please call [phone].  If you test positive for Covid 19 in the 2 weeks post procedure, please call and report this information to us.    If any biopsies were taken you will be contacted by phone or by letter within the next 1-3 weeks.  Please call us at [phone] if you have not heard about the biopsies in 3 weeks.    SIGNATURES/CONFIDENTIALITY: You and/or your care partner have signed paperwork which will be entered into your electronic medical record.  These signatures attest to the fact that that the information above on your After Visit Summary has been reviewed and is understood.  Full responsibility of the confidentiality of this discharge information lies with you and/or your care-partner.  

## 2022-02-17 ENCOUNTER — Telehealth: Payer: Self-pay

## 2022-02-17 NOTE — Telephone Encounter (Signed)
TC placed, VM obtained and message left. SChaplin, RN,BSN

## 2022-02-19 ENCOUNTER — Encounter: Payer: Self-pay | Admitting: Gastroenterology
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
## 2022-03-04 ENCOUNTER — Ambulatory Visit (INDEPENDENT_AMBULATORY_CARE_PROVIDER_SITE_OTHER): Payer: PPO | Admitting: Family Medicine

## 2022-03-04 ENCOUNTER — Other Ambulatory Visit: Payer: Self-pay | Admitting: Family Medicine

## 2022-03-04 ENCOUNTER — Telehealth: Payer: Self-pay | Admitting: *Deleted

## 2022-03-04 ENCOUNTER — Ambulatory Visit: Payer: Self-pay

## 2022-03-04 ENCOUNTER — Encounter: Payer: Self-pay | Admitting: Family Medicine

## 2022-03-04 VITALS — BP 138/80 | HR 65 | Ht 64.0 in | Wt 136.0 lb

## 2022-03-04 DIAGNOSIS — M19012 Primary osteoarthritis, left shoulder: Secondary | ICD-10-CM | POA: Diagnosis not present

## 2022-03-04 DIAGNOSIS — M25512 Pain in left shoulder: Secondary | ICD-10-CM

## 2022-03-04 DIAGNOSIS — M12812 Other specific arthropathies, not elsewhere classified, left shoulder: Secondary | ICD-10-CM | POA: Diagnosis not present

## 2022-03-04 MED ORDER — TRAZODONE HCL 50 MG PO TABS
25.0000 mg | ORAL_TABLET | Freq: Every evening | ORAL | 1 refills | Status: DC | PRN
  Filled 2022-03-04: qty 30, 30d supply, fill #0
  Filled 2022-04-13: qty 30, 30d supply, fill #1

## 2022-03-04 NOTE — Telephone Encounter (Signed)
Called pt was advised can send in low dose Trazodone to try and then follow in about 2-3 weeks  To see how things are going. Pt states yes that sound great.

## 2022-03-04 NOTE — Telephone Encounter (Signed)
Left patient a message to call and schedule annual. Since this is patient's first year with Medicare, she can schedule and then every 2 years after per Mellody Dance.

## 2022-03-04 NOTE — Assessment & Plan Note (Signed)
Patient given injection today and tolerated the procedure well, discussed icing regimen and home exercises.  Discussed which activities to do and which ones to avoid.  Increase activity slowly.  Once again the arthritic changes is fairly severe.  Hopefully the patient does respond well.

## 2022-03-04 NOTE — Patient Instructions (Signed)
tART cHERRY EXTRACT  1200 mg at night Injected 2 areas of the shoulder We will get approval for the knees and then set you up at a later date here in the near future.

## 2022-03-04 NOTE — Assessment & Plan Note (Addendum)
Chronic, with worsening symptoms.  Discussed with patient icing regimen and home exercises.  Patient given injection and hopefully will be beneficial.  We discussed with patient that this could need a possible replacement at some point.  Hopefully the patient does respond well to this, home exercises and time.  Follow-up again in 2 to 3 months

## 2022-03-05 ENCOUNTER — Other Ambulatory Visit (HOSPITAL_BASED_OUTPATIENT_CLINIC_OR_DEPARTMENT_OTHER): Payer: Self-pay

## 2022-03-05 NOTE — Telephone Encounter (Signed)
Called pt was advised and appt for 03/25/22  VV was made

## 2022-03-08 ENCOUNTER — Telehealth: Payer: Self-pay | Admitting: *Deleted

## 2022-03-08 NOTE — Telephone Encounter (Signed)
Returned call from 2:16 PM. Left patient a message to call and schedule. Will also send MyChart message.

## 2022-03-10 ENCOUNTER — Ambulatory Visit: Payer: PPO | Admitting: Family Medicine

## 2022-03-23 ENCOUNTER — Encounter: Payer: Self-pay | Admitting: Family Medicine

## 2022-03-23 ENCOUNTER — Telehealth (INDEPENDENT_AMBULATORY_CARE_PROVIDER_SITE_OTHER): Payer: PPO | Admitting: Family Medicine

## 2022-03-23 ENCOUNTER — Other Ambulatory Visit: Payer: Self-pay | Admitting: Family Medicine

## 2022-03-23 ENCOUNTER — Other Ambulatory Visit (HOSPITAL_BASED_OUTPATIENT_CLINIC_OR_DEPARTMENT_OTHER): Payer: Self-pay

## 2022-03-23 DIAGNOSIS — M545 Low back pain, unspecified: Secondary | ICD-10-CM | POA: Diagnosis not present

## 2022-03-23 DIAGNOSIS — F419 Anxiety disorder, unspecified: Secondary | ICD-10-CM | POA: Diagnosis not present

## 2022-03-23 DIAGNOSIS — E785 Hyperlipidemia, unspecified: Secondary | ICD-10-CM | POA: Diagnosis not present

## 2022-03-23 DIAGNOSIS — G4700 Insomnia, unspecified: Secondary | ICD-10-CM | POA: Diagnosis not present

## 2022-03-23 DIAGNOSIS — M25512 Pain in left shoulder: Secondary | ICD-10-CM

## 2022-03-23 DIAGNOSIS — E559 Vitamin D deficiency, unspecified: Secondary | ICD-10-CM | POA: Diagnosis not present

## 2022-03-23 MED ORDER — TIZANIDINE HCL 2 MG PO TABS
1.0000 mg | ORAL_TABLET | Freq: Three times a day (TID) | ORAL | 0 refills | Status: DC | PRN
  Filled 2022-03-23: qty 30, 5d supply, fill #0

## 2022-03-23 NOTE — Assessment & Plan Note (Signed)
Encourage heart healthy diet such as MIND or DASH diet, increase exercise, avoid trans fats, simple carbohydrates and processed foods, consider a krill or fish or flaxseed oil cap daily.  °

## 2022-03-23 NOTE — Assessment & Plan Note (Signed)
Encouraged good sleep hygiene such as dark, quiet room. No blue/green glowing lights such as computer screens in bedroom. No alcohol or stimulants in evening. Cut down on caffeine as able. Regular exercise is helpful but not just prior to bed time. Trazodone has helped sleep some but not much unfortunately she has a residual heavy headed feeling the next morning. She is going to try it for a few more weeks and then decide if she wants to switch to Amitriptyline or go back to Ambien. Continue Magnesium Glycinate 400 mg and add L Tryptophan qhs

## 2022-03-23 NOTE — Assessment & Plan Note (Signed)
She notes that this has worsened since retiring. She will consider further meds if worsens, is going to start working again per diem and is thinking that might help. She will let us know if she needs more consideration
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
## 2022-03-23 NOTE — Assessment & Plan Note (Signed)
Has been following with sports medicine Dr Tamala Julian and she has gotten good results from Steroid injection.

## 2022-03-23 NOTE — Assessment & Plan Note (Signed)
Encouraged moist heat and gentle stretching as tolerated. May try NSAIDs and prescription meds as directed and report if symptoms worsen or seek immediate care. Has had trouble off and on for years and has flared after a long car ride. Given rx for Tizanidine 1-4 mg a couple times a day as needed.

## 2022-03-23 NOTE — Assessment & Plan Note (Signed)
Supplement and monitor 

## 2022-03-25 ENCOUNTER — Telehealth: Payer: PPO | Admitting: Family Medicine

## 2022-04-13 ENCOUNTER — Other Ambulatory Visit (HOSPITAL_BASED_OUTPATIENT_CLINIC_OR_DEPARTMENT_OTHER): Payer: Self-pay

## 2022-04-14 ENCOUNTER — Encounter: Payer: Self-pay | Admitting: Family Medicine

## 2022-04-26 ENCOUNTER — Encounter: Payer: Self-pay | Admitting: Family Medicine

## 2022-04-27 ENCOUNTER — Other Ambulatory Visit (HOSPITAL_BASED_OUTPATIENT_CLINIC_OR_DEPARTMENT_OTHER): Payer: Self-pay

## 2022-04-27 MED ORDER — TEMAZEPAM 15 MG PO CAPS
15.0000 mg | ORAL_CAPSULE | Freq: Every evening | ORAL | 1 refills | Status: DC | PRN
  Filled 2022-04-27: qty 30, 30d supply, fill #0
  Filled 2022-05-26: qty 30, 30d supply, fill #1
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
## 2022-04-30 ENCOUNTER — Ambulatory Visit: Payer: Self-pay

## 2022-04-30 ENCOUNTER — Ambulatory Visit (INDEPENDENT_AMBULATORY_CARE_PROVIDER_SITE_OTHER): Payer: PPO | Admitting: Family Medicine

## 2022-04-30 ENCOUNTER — Encounter: Payer: Self-pay | Admitting: Family Medicine

## 2022-04-30 VITALS — BP 138/78 | HR 67 | Ht 64.0 in | Wt 134.0 lb

## 2022-04-30 DIAGNOSIS — M1712 Unilateral primary osteoarthritis, left knee: Secondary | ICD-10-CM | POA: Diagnosis not present

## 2022-04-30 DIAGNOSIS — M25512 Pain in left shoulder: Secondary | ICD-10-CM | POA: Diagnosis not present

## 2022-04-30 DIAGNOSIS — G8929 Other chronic pain: Secondary | ICD-10-CM

## 2022-04-30 DIAGNOSIS — M1710 Unilateral primary osteoarthritis, unspecified knee: Secondary | ICD-10-CM

## 2022-04-30 DIAGNOSIS — M25562 Pain in left knee: Secondary | ICD-10-CM | POA: Diagnosis not present

## 2022-04-30 MED ORDER — HYALURONAN 88 MG/4ML IX SOSY
88.0000 mg | PREFILLED_SYRINGE | Freq: Once | INTRA_ARTICULAR | Status: AC
  Administered 2022-04-30: 88 mg via INTRA_ARTICULAR

## 2022-04-30 NOTE — Patient Instructions (Addendum)
Happy Holidays Injected L knee today See me again in 6 weeks

## 2022-04-30 NOTE — Assessment & Plan Note (Signed)
Chronic problem with exacerbation.  Worsening pain of the left knee.  Failed all other conservative therapy and given Monovisc injection in the left knee.  Does have this problem bilaterally and will consider the possibility on the right knee depending on how patient responds.  Patient did respond extremely well though to the steroid injection previously as well as we will continue to monitor.  Looking at patient's x-rays I do not feel that patient is in any need of a replacement in the near future.  Follow-up again in 6 to 8 weeks

## 2022-05-10 ENCOUNTER — Encounter: Payer: Self-pay | Admitting: Obstetrics & Gynecology

## 2022-05-10 ENCOUNTER — Ambulatory Visit (INDEPENDENT_AMBULATORY_CARE_PROVIDER_SITE_OTHER): Payer: PPO | Admitting: Obstetrics & Gynecology

## 2022-05-10 VITALS — BP 168/90 | HR 67 | Ht 64.0 in | Wt 134.0 lb

## 2022-05-10 DIAGNOSIS — G479 Sleep disorder, unspecified: Secondary | ICD-10-CM | POA: Diagnosis not present

## 2022-05-10 DIAGNOSIS — R030 Elevated blood-pressure reading, without diagnosis of hypertension: Secondary | ICD-10-CM | POA: Diagnosis not present

## 2022-05-10 DIAGNOSIS — N951 Menopausal and female climacteric states: Secondary | ICD-10-CM | POA: Diagnosis not present

## 2022-05-10 DIAGNOSIS — Z01419 Encounter for gynecological examination (general) (routine) without abnormal findings: Secondary | ICD-10-CM

## 2022-05-10 NOTE — Progress Notes (Signed)
   Subjective:    Patient ID: Shannon [name], female    DOB: August 04, 1955, 66 y.o.   MRN: [id number]  HPI Shannon [name] is here for evaluaiton of menopausal symptoms.  Patient was on HRT and gradually weaned and stopped in July.  She does have an occasional hot flash at night.  She still has problems sleeping at night but not necessarily due to hot flashes.  She is being treated by her PCP for difficulty sleeping.  Last Mammogram: 03/23/21- negative Last Pap Smear:  04/09/21- negative Last Colon Screening;  September 2023 Seat Belts:   yes Sun Screen:   yes Dental Check Up:  yes Brush & Floss:  yes     Review of Systems  Constitutional: Negative.        Occasional hot flash  Respiratory: Negative.    Cardiovascular: Negative.   Gastrointestinal: Negative.   Genitourinary: Negative.        Objective:   Physical Exam Vitals reviewed.  Constitutional:      General: She is not in acute distress.    Appearance: She is well-developed.  HENT:     Head: Normocephalic and atraumatic.     Mouth/Throat:     Mouth: Mucous membranes are moist.     Pharynx: Oropharynx is clear.  Eyes:     Conjunctiva/sclera: Conjunctivae normal.  Cardiovascular:     Rate and Rhythm: Normal rate.  Pulmonary:     Effort: Pulmonary effort is normal.  Abdominal:     General: Abdomen is flat. There is no distension.     Palpations: Abdomen is soft.     Tenderness: There is no abdominal tenderness.  Genitourinary:    Comments: Tanner V Vulva:  No lesion Vagina:  Pink, no lesions, no discharge, no blood Cervix:  No CMT Uterus:  Non tender, mobile Right adnexa--non tender, no mass Left adnexa--non tender, no mass Skin:    General: Skin is warm and dry.  Neurological:     Mental Status: She is alert and oriented to person, place, and time.  Psychiatric:        Mood and Affect: Mood normal.    Vitals:   05/10/22 0928 05/10/22 1010  BP: (!) 168/76 (!) 168/90  Pulse: 67   Weight: 134 lb (60.8 kg)    Height: '5\' 4"'$  (1.626 m)        Assessment & Plan:     Pap normal in 2022--future pap smears not indicated Dexa next year now off HRT Reviewed Ca and Vit D intake Colon screening up to date Mammogram due this year.  Sleep hygiene reviewed and considering CBT for better sleep.  HTN--BP taken twice; Shannon [name] will need to f/u with PCP.

## 2022-05-10 NOTE — Progress Notes (Signed)
Last Mammogram: 03/23/21- negative Last Pap Smear:  04/09/21- negative Last Colon Screening;  September 2023 Seat Belts:   yes Sun Screen:   yes Dental Check Up:  yes Brush & Floss:  yes

## 2022-05-17 ENCOUNTER — Other Ambulatory Visit (HOSPITAL_BASED_OUTPATIENT_CLINIC_OR_DEPARTMENT_OTHER): Payer: Self-pay

## 2022-05-17 MED ORDER — ALPRAZOLAM 0.25 MG PO TABS
0.2500 mg | ORAL_TABLET | Freq: Once | ORAL | 0 refills | Status: AC
  Filled 2022-05-17: qty 2, 2d supply, fill #0

## 2022-05-28 ENCOUNTER — Ambulatory Visit (INDEPENDENT_AMBULATORY_CARE_PROVIDER_SITE_OTHER): Payer: PPO | Admitting: Family Medicine

## 2022-05-28 ENCOUNTER — Other Ambulatory Visit (HOSPITAL_BASED_OUTPATIENT_CLINIC_OR_DEPARTMENT_OTHER): Payer: Self-pay

## 2022-05-28 ENCOUNTER — Ambulatory Visit: Payer: Self-pay

## 2022-05-28 VITALS — BP 138/84 | HR 95 | Ht 64.0 in

## 2022-05-28 DIAGNOSIS — M11261 Other chondrocalcinosis, right knee: Secondary | ICD-10-CM

## 2022-05-28 DIAGNOSIS — M25561 Pain in right knee: Secondary | ICD-10-CM | POA: Diagnosis not present

## 2022-05-28 DIAGNOSIS — M25461 Effusion, right knee: Secondary | ICD-10-CM | POA: Diagnosis not present

## 2022-05-28 MED ORDER — TRAMADOL HCL 50 MG PO TABS
50.0000 mg | ORAL_TABLET | Freq: Three times a day (TID) | ORAL | 0 refills | Status: DC | PRN
  Filled 2022-05-28: qty 15, 5d supply, fill #0

## 2022-05-28 NOTE — Patient Instructions (Addendum)
Thank you for coming in today.   You received an injection today. Seek immediate medical attention if the joint becomes red, extremely painful, or is oozing fluid.   Labs should be back some tomorrow.   Let me know if you have a problem.   Keep your appointment with Dr Tamala Julian on the 27th.

## 2022-05-28 NOTE — Progress Notes (Signed)
I, Shannon [name], LAT, ATC acting as a scribe for Shannon Leader, MD.  Shannon [name] is a 66 y.o. female who presents to [place] at [facility] today for R knee pain. Pt was previously seen by Dr. Tamala Julian on 04/30/22 for bilat knee pain and was given a Monovisc injection in her L knee. Today, pt c/o R knee becoming painful yesterday, w/ no MOI. Pt locates pain to the anterior aspect of the R knee. Pt note the pain started in her R Great toe, becoming very sore and painful. No MOI, but pt notes she did have a recent flight.  R Knee swelling: yes Mechanical symptoms: unable to determine Aggravates: knee flex Treatments tried: Tylenol, IBU  Dx imaging: 01/28/22 Bilat standing knee AP  Pertinent review of systems: No fevers or chills  Relevant historical information: No history of gout.  Rheumatologic workup was completed recently and uric acid was less than 3   Exam:  BP 138/84   Pulse 95   Ht '5\' 4"'$  (1.626 m)   SpO2 100%   BMI 23.00 kg/m  General: Well Developed, well nourished, and in no acute distress.   MSK: Right knee: Large joint effusion.  Decreased range of motion.  Tender palpation medial joint line. Significant antalgic gait using a cane to ambulate.    Lab and Radiology Results  Procedure: Real-time Ultrasound Guided aspiration and injection of right knee superior lateral patellar space Device: Philips Affiniti 50G Images permanently stored and available for review in PACS Verbal informed consent obtained.  Discussed risks and benefits of procedure. Warned about infection, bleeding, hyperglycemia damage to structures among others. Patient expresses understanding and agreement Time-out conducted.   Noted no overlying erythema, induration, or other signs of local infection.   Skin prepped in a sterile fashion.   Local anesthesia: Topical Ethyl chloride.   With sterile technique and under real time ultrasound guidance: 2 mL of lidocaine injected  into subcutaneous tissue achieving good anesthesia Sterilized with isopropyl alcohol. 18-gauge needle was used to access the knee joint. 60 mL of mildly cloudy yellow fluid was aspirated. Syringe was exchanged and 40 mg of Kenalog and 2 mL of Marcaine was injected into the knee joint. Knee was seen to be decompressed with ultrasound following aspiration. Completed without difficulty   Pain significantly resolved suggesting accurate placement of the medication.   Advised to call if fevers/chills, erythema, induration, drainage, or persistent bleeding.   Images permanently stored and available for review in the ultrasound unit.  Impression: Technically successful ultrasound guided injection.    EXAM: BILATERAL KNEES STANDING - 1 VIEW   COMPARISON:  None Available.   FINDINGS: No displaced fractures are seen. Degenerative changes are noted with small bony spurs and chondrocalcinosis. Evaluation of patella is limited without lateral views. There are smoothly marginated calcifications in the upper margin of patella in both knees, possibly residual from previous injury.   IMPRESSION: No fracture is seen in single AP view of both knees. Degenerative changes are noted with small bony spurs and chondrocalcinosis.     Electronically Signed   By: Elmer Picker M.D.   On: 01/29/2022 19:33   I, Shannon [name], personally (independently) visualized and performed the interpretation of the images attached in this note.      Assessment and Plan: 66 y.o. female with right knee pain and effusion without injury.  Etiology is unclear.  Could be exacerbation of DJD or potential pseudogout.  She does have chondrocalcinosis on x-ray  from August.  Will send knee aspiration fluid for synovial fluid analysis including crystal evaluation and culture. Would consider starting colchicine if calcium pyrophosphate crystals are seen on synovial fluid analysis. Recheck as needed with me.  She has an  appointment scheduled with my partner Dr. Tamala Julian on December 27.  She should keep that appointment. Tramadol prescribed for pain control.   PDMP reviewed during this encounter. Orders Placed This Encounter  Procedures   Anaerobic and Aerobic Culture    Standing Status:   Future    Number of Occurrences:   1    Standing Expiration Date:   05/29/2023   [place] LIMITED JOINT SPACE STRUCTURES LOW RIGHT(NO LINKED CHARGES)    Order Specific Question:   Reason for Exam (SYMPTOM  OR DIAGNOSIS REQUIRED)    Answer:   right knee pain    Order Specific Question:   Preferred imaging location?    Answer:   [place]   Synovial Fluid Analysis, Complete    Standing Status:   Future    Number of Occurrences:   1    Standing Expiration Date:   05/29/2023   Meds ordered this encounter  Medications   traMADol (ULTRAM) 50 MG tablet    Sig: Take 1 tablet (50 mg total) by mouth every 8 (eight) hours as needed for severe pain.    Dispense:  15 tablet    Refill:  0     Discussed warning signs or symptoms. Please see discharge instructions. Patient expresses understanding.   The above documentation has been reviewed and is accurate and complete Shannon [name], M.D.

## 2022-05-31 ENCOUNTER — Telehealth: Payer: Self-pay

## 2022-05-31 ENCOUNTER — Other Ambulatory Visit (HOSPITAL_BASED_OUTPATIENT_CLINIC_OR_DEPARTMENT_OTHER): Payer: Self-pay

## 2022-05-31 ENCOUNTER — Other Ambulatory Visit: Payer: Self-pay

## 2022-05-31 DIAGNOSIS — M25461 Effusion, right knee: Secondary | ICD-10-CM

## 2022-05-31 MED ORDER — DOXYCYCLINE HYCLATE 50 MG PO CAPS
50.0000 mg | ORAL_CAPSULE | Freq: Two times a day (BID) | ORAL | 0 refills | Status: DC
  Filled 2022-05-31: qty 20, 10d supply, fill #0

## 2022-05-31 NOTE — Telephone Encounter (Signed)
Forwarded pt's culture results to Dr. Tamala Julian who advised to prescribe doxycyline. Sent rx to her preferred pharmacy and called and left pt a VM informing her and to call w/ any questions or concerns.
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
## 2022-06-03 LAB — SYNOVIAL FLUID ANALYSIS, COMPLETE
Basophils, %: 0 %
Eosinophils-Synovial: 0 % (ref 0–2)
Lymphocytes-Synovial Fld: 4 % (ref 0–74)
Monocyte/Macrophage: 9 % (ref 0–69)
Neutrophil, Synovial: 87 % — ABNORMAL HIGH (ref 0–24)
Synoviocytes, %: 0 % (ref 0–15)
WBC, Synovial: 18745 cells/uL — ABNORMAL HIGH (ref <150)

## 2022-06-03 LAB — ANAEROBIC AND AEROBIC CULTURE
AER RESULT:: NO GROWTH
MICRO NUMBER:: 14321365
MICRO NUMBER:: 14321366
SPECIMEN QUALITY:: ADEQUATE
SPECIMEN QUALITY:: ADEQUATE

## 2022-06-04 NOTE — Progress Notes (Signed)
No crystals were seen.  The white blood cell count was a tad elevated but the culture did ultimately come back negative.  Dr. Tamala Julian did a good job by the putting on antibiotics just to be safe.  It does not look like you ever did have an infection thankfully. Please keep your appointment with him.

## 2022-06-09 ENCOUNTER — Ambulatory Visit (INDEPENDENT_AMBULATORY_CARE_PROVIDER_SITE_OTHER): Payer: PPO | Admitting: Family Medicine

## 2022-06-09 ENCOUNTER — Ambulatory Visit: Payer: Self-pay

## 2022-06-09 ENCOUNTER — Encounter: Payer: Self-pay | Admitting: Family Medicine

## 2022-06-09 VITALS — BP 110/78 | HR 67 | Ht 64.0 in | Wt 129.0 lb

## 2022-06-09 DIAGNOSIS — G5761 Lesion of plantar nerve, right lower limb: Secondary | ICD-10-CM

## 2022-06-09 DIAGNOSIS — M1712 Unilateral primary osteoarthritis, left knee: Secondary | ICD-10-CM

## 2022-06-09 DIAGNOSIS — M1710 Unilateral primary osteoarthritis, unspecified knee: Secondary | ICD-10-CM

## 2022-06-09 DIAGNOSIS — M25562 Pain in left knee: Secondary | ICD-10-CM

## 2022-06-09 NOTE — Assessment & Plan Note (Signed)
Patient given injection and tolerated the procedure well, discussed icing regimen and home exercises, discussed which activities to do and which ones to avoid.  Patient is going to continue to work on some of the exercises.  Did discuss long-term if necessary could get advanced imaging.  Follow-up again in 6 to 8 weeks

## 2022-06-09 NOTE — Patient Instructions (Addendum)
Great to see you Possible R monovisc injection  Injected left knee and foot 6-8 week follow up

## 2022-06-09 NOTE — Assessment & Plan Note (Signed)
Chronic problem with worsening symptoms.  Given injection.  Discussed icing regimen and home exercises.  Discussed which activities to do and which ones to avoid.  Increase activity slowly otherwise.  Follow-up again after trying more of the other conservative therapies.

## 2022-06-21 ENCOUNTER — Telehealth: Payer: Self-pay | Admitting: *Deleted

## 2022-06-21 ENCOUNTER — Other Ambulatory Visit: Payer: Self-pay | Admitting: Family Medicine

## 2022-06-21 ENCOUNTER — Encounter: Payer: Self-pay | Admitting: Family Medicine

## 2022-06-21 DIAGNOSIS — L239 Allergic contact dermatitis, unspecified cause: Secondary | ICD-10-CM

## 2022-06-21 NOTE — Telephone Encounter (Signed)
R knee monovisc initiated through portal.

## 2022-06-21 NOTE — Telephone Encounter (Signed)
R knee Monovisc approved. No pre-cert required.  Once the OOP is met, pt is covered at 100%.

## 2022-06-21 NOTE — Telephone Encounter (Signed)
-----   Message from [facility], LAT sent at 06/09/2022 10:31 AM EST ----- R knee monovisc

## 2022-06-22 ENCOUNTER — Other Ambulatory Visit (HOSPITAL_BASED_OUTPATIENT_CLINIC_OR_DEPARTMENT_OTHER): Payer: Self-pay

## 2022-06-22 MED ORDER — LEVOCETIRIZINE DIHYDROCHLORIDE 5 MG PO TABS
5.0000 mg | ORAL_TABLET | Freq: Every evening | ORAL | 2 refills | Status: DC
  Filled 2022-06-22: qty 30, 30d supply, fill #0

## 2022-06-22 MED ORDER — TEMAZEPAM 15 MG PO CAPS
15.0000 mg | ORAL_CAPSULE | Freq: Every evening | ORAL | 1 refills | Status: DC | PRN
  Filled 2022-06-22 – 2022-06-25 (×2): qty 30, 30d supply, fill #0
  Filled 2022-07-26: qty 30, 30d supply, fill #1

## 2022-06-22 NOTE — Telephone Encounter (Signed)
Requesting: temazepam '15mg'$   Contract: 09/25/21 UDS: None Last Visit: 03/23/22 Next Visit: 06/24/22 Last Refill: 04/27/22 #30 and 0RF   Please Advise

## 2022-06-24 ENCOUNTER — Telehealth (INDEPENDENT_AMBULATORY_CARE_PROVIDER_SITE_OTHER): Payer: PPO | Admitting: Family Medicine

## 2022-06-24 VITALS — BP 132/76

## 2022-06-24 DIAGNOSIS — M25561 Pain in right knee: Secondary | ICD-10-CM

## 2022-06-24 DIAGNOSIS — G4700 Insomnia, unspecified: Secondary | ICD-10-CM
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
## 2022-06-25 ENCOUNTER — Other Ambulatory Visit (HOSPITAL_BASED_OUTPATIENT_CLINIC_OR_DEPARTMENT_OTHER): Payer: Self-pay

## 2022-06-25 DIAGNOSIS — M25561 Pain in right knee: Secondary | ICD-10-CM | POA: Insufficient documentation

## 2022-06-25 DIAGNOSIS — M170 Bilateral primary osteoarthritis of knee: Secondary | ICD-10-CM | POA: Insufficient documentation

## 2022-06-25 NOTE — Assessment & Plan Note (Signed)
Encouraged good sleep hygiene such as dark, quiet room. No blue/green glowing lights such as computer screens in bedroom. No alcohol or stimulants in evening. Cut down on caffeine as able. Regular exercise is helpful but not just prior to bed time. Restoril has been helpful. Will continue the same. Continue Magnesium Glycinate 400 mg and add L Tryptophan qhs

## 2022-06-25 NOTE — Assessment & Plan Note (Signed)
Has been following with Sports med and some injections have been helpful her pain is much better

## 2022-06-27 ENCOUNTER — Encounter: Payer: Self-pay | Admitting: Family Medicine
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
## 2022-07-21 ENCOUNTER — Encounter: Payer: PPO | Admitting: Family Medicine

## 2022-07-21 ENCOUNTER — Ambulatory Visit: Payer: Self-pay

## 2022-07-21 VITALS — BP 120/78 | HR 67 | Ht 64.0 in | Wt 136.0 lb

## 2022-07-26 ENCOUNTER — Other Ambulatory Visit: Payer: Self-pay

## 2022-07-26 DIAGNOSIS — G4719 Other hypersomnia: Secondary | ICD-10-CM | POA: Diagnosis not present

## 2022-07-26 DIAGNOSIS — F5104 Psychophysiologic insomnia: Secondary | ICD-10-CM | POA: Diagnosis not present
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
## 2022-07-27 ENCOUNTER — Ambulatory Visit: Payer: PPO | Admitting: Family Medicine

## 2022-07-27 ENCOUNTER — Encounter: Payer: Self-pay | Admitting: Family Medicine

## 2022-07-27 VITALS — BP 132/80 | HR 69 | Ht 64.0 in | Wt 136.0 lb

## 2022-07-27 DIAGNOSIS — M25561 Pain in right knee: Secondary | ICD-10-CM | POA: Diagnosis not present

## 2022-07-27 DIAGNOSIS — M1710 Unilateral primary osteoarthritis, unspecified knee: Secondary | ICD-10-CM

## 2022-07-27 MED ORDER — HYALURONAN 88 MG/4ML IX SOSY
88.0000 mg | PREFILLED_SYRINGE | Freq: Once | INTRA_ARTICULAR | Status: AC
  Administered 2022-07-27: 88 mg via INTRA_ARTICULAR

## 2022-07-27 NOTE — Patient Instructions (Addendum)
Monovisc today See me in May 17th or later

## 2022-08-12 ENCOUNTER — Encounter: Payer: Self-pay | Admitting: Family Medicine

## 2022-08-16 ENCOUNTER — Other Ambulatory Visit (HOSPITAL_BASED_OUTPATIENT_CLINIC_OR_DEPARTMENT_OTHER): Payer: Self-pay

## 2022-08-16 ENCOUNTER — Other Ambulatory Visit: Payer: Self-pay | Admitting: Family Medicine

## 2022-08-16 MED ORDER — TEMAZEPAM 15 MG PO CAPS
15.0000 mg | ORAL_CAPSULE | Freq: Every evening | ORAL | 2 refills | Status: DC | PRN
  Filled 2022-08-16 – 2022-08-24 (×2): qty 30, 30d supply, fill #0

## 2022-08-17 ENCOUNTER — Other Ambulatory Visit: Payer: Self-pay | Admitting: Family Medicine

## 2022-08-17 ENCOUNTER — Other Ambulatory Visit (HOSPITAL_BASED_OUTPATIENT_CLINIC_OR_DEPARTMENT_OTHER): Payer: Self-pay

## 2022-08-17 DIAGNOSIS — G4710 Hypersomnia, unspecified: Secondary | ICD-10-CM | POA: Diagnosis not present

## 2022-08-17 MED ORDER — TEMAZEPAM 7.5 MG PO CAPS
7.5000 mg | ORAL_CAPSULE | Freq: Every evening | ORAL | 3 refills | Status: DC | PRN
  Filled 2022-08-17: qty 30, 30d supply, fill #0

## 2022-08-18 ENCOUNTER — Telehealth: Payer: Self-pay

## 2022-08-18 ENCOUNTER — Other Ambulatory Visit: Payer: Self-pay | Admitting: Family Medicine

## 2022-08-18 MED ORDER — TEMAZEPAM 15 MG PO CAPS
15.0000 mg | ORAL_CAPSULE | Freq: Every evening | ORAL | 2 refills | Status: DC | PRN
  Filled 2022-08-18 – 2022-08-24 (×2): qty 30, 30d supply, fill #0
  Filled 2022-09-27: qty 30, 30d supply, fill #1

## 2022-08-18 NOTE — Telephone Encounter (Signed)
PA form received, completed and faxed back to [employer] at [phone]. Awaiting determination.

## 2022-08-18 NOTE — Telephone Encounter (Signed)
PA approved. Effective 08/18/22 to 06/14/23.

## 2022-08-19 ENCOUNTER — Other Ambulatory Visit (HOSPITAL_BASED_OUTPATIENT_CLINIC_OR_DEPARTMENT_OTHER): Payer: Self-pay

## 2022-08-23 ENCOUNTER — Encounter: Payer: Self-pay | Admitting: Obstetrics & Gynecology

## 2022-08-24 ENCOUNTER — Other Ambulatory Visit (HOSPITAL_BASED_OUTPATIENT_CLINIC_OR_DEPARTMENT_OTHER): Payer: Self-pay

## 2022-08-25 ENCOUNTER — Ambulatory Visit: Payer: PPO

## 2022-08-30 ENCOUNTER — Ambulatory Visit (HOSPITAL_BASED_OUTPATIENT_CLINIC_OR_DEPARTMENT_OTHER)
Admission: RE | Admit: 2022-08-30 | Discharge: 2022-08-30 | Disposition: A | Discharge status: Home / Self Care | Payer: PPO | Source: Ambulatory Visit | Attending: Obstetrics & Gynecology | Admitting: Obstetrics & Gynecology

## 2022-08-30 ENCOUNTER — Encounter (HOSPITAL_BASED_OUTPATIENT_CLINIC_OR_DEPARTMENT_OTHER): Payer: Self-pay

## 2022-08-30 DIAGNOSIS — Z1231 Encounter for screening mammogram for malignant neoplasm of breast: Secondary | ICD-10-CM | POA: Insufficient documentation

## 2022-08-30 DIAGNOSIS — Z01419 Encounter for gynecological examination (general) (routine) without abnormal findings: Secondary | ICD-10-CM | POA: Diagnosis not present

## 2022-09-27 ENCOUNTER — Other Ambulatory Visit: Payer: Self-pay

## 2022-10-03 NOTE — Assessment & Plan Note (Signed)
Encourage heart healthy diet such as MIND or DASH diet, increase exercise, avoid trans fats, simple carbohydrates and processed foods, consider a krill or fish or flaxseed oil cap daily.  °

## 2022-10-03 NOTE — Assessment & Plan Note (Signed)
Asymptomatic and mild, monitor 

## 2022-10-03 NOTE — Assessment & Plan Note (Signed)
Patient encouraged to maintain heart healthy diet, regular exercise, adequate sleep. Consider daily probiotics. Take medications as prescribed. Labs reviewed. Colonoscopy 2023, September, repeat in 5 years. MGM 08/2021 due in 2025. Dexa repeat in 2024. Pap 10/22 repeat in 3-5 years. Shingrix is the new shingles shot, 2 shots over 2-6 months, confirm coverage with insurance and document, then can return here for shots with nurse appt or at pharmacy. Encouraged Prevnar 20 she agrees to take it at a later date.consider rsv and tetanus. Given and reviewed copy of ACP documents from [employer] and encouraged to complete and return

## 2022-10-03 NOTE — Assessment & Plan Note (Addendum)
Supplement and monitor, increase daily supplement by 1000 IU daily

## 2022-10-04 ENCOUNTER — Ambulatory Visit (INDEPENDENT_AMBULATORY_CARE_PROVIDER_SITE_OTHER): Payer: PPO | Admitting: Family Medicine

## 2022-10-04 ENCOUNTER — Other Ambulatory Visit: Payer: Self-pay | Admitting: Family Medicine

## 2022-10-04 ENCOUNTER — Other Ambulatory Visit (HOSPITAL_BASED_OUTPATIENT_CLINIC_OR_DEPARTMENT_OTHER): Payer: Self-pay

## 2022-10-04 ENCOUNTER — Telehealth (HOSPITAL_BASED_OUTPATIENT_CLINIC_OR_DEPARTMENT_OTHER): Payer: Self-pay

## 2022-10-04 ENCOUNTER — Encounter: Payer: Self-pay | Admitting: Family Medicine

## 2022-10-04 VITALS — BP 138/72 | HR 77 | Temp 97.5°F | Resp 16 | Ht 64.0 in | Wt 136.2 lb

## 2022-10-04 DIAGNOSIS — G4700 Insomnia, unspecified: Secondary | ICD-10-CM | POA: Diagnosis not present

## 2022-10-04 DIAGNOSIS — Z0001 Encounter for general adult medical examination with abnormal findings: Secondary | ICD-10-CM | POA: Diagnosis not present

## 2022-10-04 DIAGNOSIS — D696 Thrombocytopenia, unspecified: Secondary | ICD-10-CM | POA: Diagnosis not present

## 2022-10-04 DIAGNOSIS — H93A9 Pulsatile tinnitus, unspecified ear: Secondary | ICD-10-CM | POA: Diagnosis not present

## 2022-10-04 DIAGNOSIS — M545 Low back pain, unspecified: Secondary | ICD-10-CM

## 2022-10-04 DIAGNOSIS — E559 Vitamin D deficiency, unspecified: Secondary | ICD-10-CM | POA: Diagnosis not present

## 2022-10-04 DIAGNOSIS — E785 Hyperlipidemia, unspecified: Secondary | ICD-10-CM | POA: Diagnosis not present

## 2022-10-04 DIAGNOSIS — Z0000 Encounter for general adult medical examination without abnormal findings: Secondary | ICD-10-CM

## 2022-10-04 LAB — LIPID PANEL
Cholesterol: 224 mg/dL — ABNORMAL HIGH (ref 0–200)
HDL: 61.3 mg/dL (ref >39.00)
LDL Cholesterol: 143 mg/dL — ABNORMAL HIGH (ref 0–99)
NonHDL: 163.1
Total CHOL/HDL Ratio: 4
Triglycerides: 103 mg/dL (ref 0.0–149.0)
VLDL: 20.6 mg/dL (ref 0.0–40.0)

## 2022-10-04 LAB — COMPREHENSIVE METABOLIC PANEL
ALT: 21 U/L (ref 0–35)
AST: 25 U/L (ref 0–37)
Albumin: 4.6 g/dL (ref 3.5–5.2)
Alkaline Phosphatase: 33 U/L — ABNORMAL LOW (ref 39–117)
BUN: 11 mg/dL (ref 6–23)
CO2: 29 mEq/L (ref 19–32)
Calcium: 9.4 mg/dL (ref 8.4–10.5)
Chloride: 100 mEq/L (ref 96–112)
Creatinine, Ser: 0.67 mg/dL (ref 0.40–1.20)
GFR: 90.01 mL/min (ref >60.00)
Glucose, Bld: 81 mg/dL (ref 70–99)
Potassium: 4 mEq/L (ref 3.5–5.1)
Sodium: 137 mEq/L (ref 135–145)
Total Bilirubin: 0.6 mg/dL (ref 0.2–1.2)
Total Protein: 7 g/dL (ref 6.0–8.3)

## 2022-10-04 LAB — CBC WITH DIFFERENTIAL/PLATELET
Basophils Absolute: 0 10*3/uL (ref 0.0–0.1)
Basophils Relative: 1.1 % (ref 0.0–3.0)
Eosinophils Absolute: 0.1 10*3/uL (ref 0.0–0.7)
Eosinophils Relative: 1.4 % (ref 0.0–5.0)
HCT: 37.7 % (ref 36.0–46.0)
Hemoglobin: 13 g/dL (ref 12.0–15.0)
Lymphocytes Relative: 31.5 % (ref 12.0–46.0)
Lymphs Abs: 1.3 10*3/uL (ref 0.7–4.0)
MCHC: 34.6 g/dL (ref 30.0–36.0)
MCV: 95.1 fl (ref 78.0–100.0)
Monocytes Absolute: 0.4 10*3/uL (ref 0.1–1.0)
Monocytes Relative: 9.4 % (ref 3.0–12.0)
Neutro Abs: 2.4 10*3/uL (ref 1.4–7.7)
Neutrophils Relative %: 56.6 % (ref 43.0–77.0)
Platelets: 149 10*3/uL — ABNORMAL LOW (ref 150.0–400.0)
RBC: 3.96 Mil/uL (ref 3.87–5.11)
RDW: 13.2 % (ref 11.5–15.5)
WBC: 4.3 10*3/uL (ref 4.0–10.5)

## 2022-10-04 LAB — TSH: TSH: 1.63 u[IU]/mL (ref 0.35–5.50)

## 2022-10-04 LAB — VITAMIN D 25 HYDROXY (VIT D DEFICIENCY, FRACTURES): VITD: 29.09 ng/mL — ABNORMAL LOW (ref 30.00–100.00)

## 2022-10-04 MED ORDER — TEMAZEPAM 7.5 MG PO CAPS
7.5000 mg | ORAL_CAPSULE | Freq: Every evening | ORAL | 0 refills | Status: DC | PRN
  Filled 2022-10-04: qty 30, 30d supply, fill #0

## 2022-10-04 MED ORDER — BOOSTRIX 5-2.5-18.5 LF-MCG/0.5 IM SUSY
0.5000 mL | PREFILLED_SYRINGE | Freq: Once | INTRAMUSCULAR | 0 refills | Status: AC
  Filled 2022-10-04: qty 0.5, 1d supply, fill #0

## 2022-10-04 MED ORDER — TIZANIDINE HCL 2 MG PO TABS
1.0000 mg | ORAL_TABLET | Freq: Three times a day (TID) | ORAL | 2 refills | Status: DC | PRN
  Filled 2022-10-04: qty 30, 5d supply, fill #0
  Filled 2022-12-02: qty 30, 5d supply, fill #1
  Filled 2023-03-24 (×2): qty 30, 5d supply, fill #2

## 2022-10-04 MED ORDER — SHINGRIX 50 MCG/0.5ML IM SUSR
0.5000 mL | Freq: Once | INTRAMUSCULAR | 0 refills | Status: AC
  Filled 2022-10-04: qty 0.5, 1d supply, fill #0

## 2022-10-04 MED ORDER — TEMAZEPAM 7.5 MG PO CAPS: 7.5000 mg | ORAL_CAPSULE | Freq: Every evening | ORAL | 0 refills | Status: DC | PRN

## 2022-10-04 NOTE — Assessment & Plan Note (Signed)
Sleep study with [facility] showed no Apneic episodes. Is working hard to get off of sleep medications with classes etc. Is considering [facility]BT[facility] but only one counselor in the area and they are not covered by her insurance. She is still considering. Continue meds for now

## 2022-10-04 NOTE — Patient Instructions (Addendum)
Shingrix is the new shingles shot, 2 shots over 2-6 months, confirm coverage with insurance and document, then can return here for shots with nurse appt or at pharmacy. Encouraged Prevnar 20 and RSV, Respiratory Syncitial Virus vaccine, Arexvy vaccine and Tetanus booster   Preventive Care 65 Years and Older, Female Preventive care refers to lifestyle choices and visits with your health care provider that can promote health and wellness. Preventive care visits are also called wellness exams. What can I expect for my preventive care visit? Counseling Your health care provider may ask you questions about your: Medical history, including: Past medical problems. Family medical history. Pregnancy and menstrual history. History of falls. Current health, including: Memory and ability to understand (cognition). Emotional well-being. Home life and relationship well-being. Sexual activity and sexual health. Lifestyle, including: Alcohol, nicotine or tobacco, and drug use. Access to firearms. Diet, exercise, and sleep habits. Work and work Astronomer. Sunscreen use. Safety issues such as seatbelt and bike helmet use. Physical exam Your health care provider will check your: Height and weight. These may be used to calculate your BMI (body mass index). BMI is a measurement that tells if you are at a healthy weight. Waist circumference. This measures the distance around your waistline. This measurement also tells if you are at a healthy weight and may help predict your risk of certain diseases, such as type 2 diabetes and high blood pressure. Heart rate and blood pressure. Body temperature. Skin for abnormal spots. What immunizations do I need?  Vaccines are usually given at various ages, according to a schedule. Your health care provider will recommend vaccines for you based on your age, medical history, and lifestyle or other factors, such as travel or where you work. What tests do I  need? Screening Your health care provider may recommend screening tests for certain conditions. This may include: Lipid and cholesterol levels. Hepatitis C test. Hepatitis B test. HIV (human immunodeficiency virus) test. STI (sexually transmitted infection) testing, if you are at risk. Lung cancer screening. Colorectal cancer screening. Diabetes screening. This is done by checking your blood sugar (glucose) after you have not eaten for a while (fasting). Mammogram. Talk with your health care provider about how often you should have regular mammograms. BRCA-related cancer screening. This may be done if you have a family history of breast, ovarian, tubal, or peritoneal cancers. Bone density scan. This is done to screen for osteoporosis. Talk with your health care provider about your test results, treatment options, and if necessary, the need for more tests. Follow these instructions at home: Eating and drinking  Eat a diet that includes fresh fruits and vegetables, whole grains, lean protein, and low-fat dairy products. Limit your intake of foods with high amounts of sugar, saturated fats, and salt. Take vitamin and mineral supplements as recommended by your health care provider. Do not drink alcohol if your health care provider tells you not to drink. If you drink alcohol: Limit how much you have to 0-1 drink a day. Know how much alcohol is in your drink. In the [place] one drink equals one 12 oz bottle of beer (355 mL), one 5 oz glass of wine (148 mL), or one 1 oz glass of hard liquor (44 mL). Lifestyle Brush your teeth every morning and night with fluoride toothpaste. Floss one time each day. Exercise for at least 30 minutes 5 or more days each week. Do not use any products that contain nicotine or tobacco. These products include cigarettes, chewing tobacco, and vaping  devices, such as e-cigarettes. If you need help quitting, ask your health care provider. Do not use drugs. If you are  sexually active, practice safe sex. Use a condom or other form of protection in order to prevent STIs. Take aspirin only as told by your health care provider. Make sure that you understand how much to take and what form to take. Work with your health care provider to find out whether it is safe and beneficial for you to take aspirin daily. Ask your health care provider if you need to take a cholesterol-lowering medicine (statin). Find healthy ways to manage stress, such as: Meditation, yoga, or listening to music. Journaling. Talking to a trusted person. Spending time with friends and family. Minimize exposure to UV radiation to reduce your risk of skin cancer. Safety Always wear your seat belt while driving or riding in a vehicle. Do not drive: If you have been drinking alcohol. Do not ride with someone who has been drinking. When you are tired or distracted. While texting. If you have been using any mind-altering substances or drugs. Wear a helmet and other protective equipment during sports activities. If you have firearms in your house, make sure you follow all gun safety procedures. What's next? Visit your health care provider once a year for an annual wellness visit. Ask your health care provider how often you should have your eyes and teeth checked. Stay up to date on all vaccines. This information is not intended to replace advice given to you by your health care provider. Make sure you discuss any questions you have with your health care provider. Document Revised: 11/26/2020 Document Reviewed: 11/26/2020 [employer] Patient Education  [place].
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
## 2022-10-05 ENCOUNTER — Encounter: Payer: Self-pay | Admitting: Family Medicine

## 2022-10-05 DIAGNOSIS — H93A9 Pulsatile tinnitus, unspecified ear: Secondary | ICD-10-CM | POA: Insufficient documentation

## 2022-10-05 NOTE — Assessment & Plan Note (Signed)
No hearing changes, mild and intermittent. No vertigo, falls, febrile illness or trauma. Will start with carotid dopplers and consider further work up if symptoms worsen or persist

## 2022-10-05 NOTE — Assessment & Plan Note (Signed)
Encouraged moist heat and gentle stretching as tolerated. May try NSAIDs and prescription meds as directed and report if symptoms worsen or seek immediate care. Uses Tizanidine infrequently with good results. Refill given

## 2022-10-09 ENCOUNTER — Encounter: Payer: Self-pay | Admitting: Family Medicine

## 2022-10-14 ENCOUNTER — Ambulatory Visit (HOSPITAL_BASED_OUTPATIENT_CLINIC_OR_DEPARTMENT_OTHER)
Admission: RE | Admit: 2022-10-14 | Discharge: 2022-10-14 | Disposition: A | Discharge status: Home / Self Care | Payer: PPO | Source: Ambulatory Visit | Attending: Family Medicine | Admitting: Family Medicine

## 2022-10-14 DIAGNOSIS — E785 Hyperlipidemia, unspecified: Secondary | ICD-10-CM | POA: Diagnosis not present

## 2022-10-14 DIAGNOSIS — H93A2 Pulsatile tinnitus, left ear: Secondary | ICD-10-CM | POA: Diagnosis not present

## 2022-10-14 DIAGNOSIS — H93A9 Pulsatile tinnitus, unspecified ear: Secondary | ICD-10-CM

## 2022-10-28 ENCOUNTER — Other Ambulatory Visit: Payer: Self-pay | Admitting: Family Medicine

## 2022-10-28 MED ORDER — TEMAZEPAM 7.5 MG PO CAPS: 7.5000 mg | ORAL_CAPSULE | Freq: Every evening | ORAL | 0 refills | Status: DC | PRN

## 2022-10-29 DIAGNOSIS — M2021 Hallux rigidus, right foot: Secondary | ICD-10-CM | POA: Diagnosis not present

## 2022-10-29 DIAGNOSIS — M2041 Other hammer toe(s) (acquired), right foot: Secondary | ICD-10-CM | POA: Diagnosis not present

## 2022-11-04 ENCOUNTER — Telehealth: Payer: Self-pay | Admitting: Family Medicine

## 2022-11-04 NOTE — Telephone Encounter (Signed)
Copied from CRM [id number]. Topic: Medicare AWV >> Nov 04, 2022 10:15 AM Payton Doughty wrote: Reason for CRM: Called patient to schedule Medicare Annual Wellness Visit (AWV). Left message for patient to call back and schedule Medicare Annual Wellness Visit (AWV).  Last date of AWV: NONE  Please schedule an appointment at any time with Shannon [name], CMA  .  If any questions, please contact me.  Thank you ,  Verlee Rossetti; Care Guide Ambulatory Clinical Support [place] [facility] Direct Dial: [phone]

## 2022-11-09 ENCOUNTER — Telehealth: Payer: Self-pay | Admitting: Family Medicine

## 2022-11-09 NOTE — Telephone Encounter (Signed)
Patient called asking if she could go ahead and be run for Monovisc again?  Please advise.

## 2022-11-09 NOTE — Telephone Encounter (Signed)
Sent patient MyChart message about having to wait 6 months between visco injections. Offered appointment prior to August if she needs to come in for a steroid injection.
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
## 2022-11-26 ENCOUNTER — Ambulatory Visit (INDEPENDENT_AMBULATORY_CARE_PROVIDER_SITE_OTHER): Payer: PPO | Admitting: Family Medicine

## 2022-11-26 VITALS — BP 122/82 | HR 56 | Ht 64.0 in | Wt 136.0 lb

## 2022-11-26 DIAGNOSIS — M1710 Unilateral primary osteoarthritis, unspecified knee: Secondary | ICD-10-CM

## 2022-11-26 NOTE — Assessment & Plan Note (Signed)
Bilateral injections given today and tolerated the procedure well, discussed icing regimen and home exercises, discussed which activities to do and which ones to avoid.  Did respond fairly well to viscosupplementation previously and will consider doing it again in August.  Continue to be active.  Follow-up again in 3 months

## 2022-11-26 NOTE — Patient Instructions (Signed)
Injected both knees today See me August 14th or after for gel

## 2022-12-02 ENCOUNTER — Other Ambulatory Visit: Payer: Self-pay | Admitting: Family Medicine

## 2022-12-02 MED ORDER — TEMAZEPAM 7.5 MG PO CAPS: 7.5000 mg | ORAL_CAPSULE | Freq: Every evening | ORAL | 0 refills | Status: DC | PRN

## 2022-12-08 DIAGNOSIS — H9313 Tinnitus, bilateral: Secondary | ICD-10-CM | POA: Diagnosis not present

## 2022-12-10 ENCOUNTER — Other Ambulatory Visit: Payer: Self-pay | Admitting: *Deleted

## 2022-12-10 DIAGNOSIS — I8391 Asymptomatic varicose veins of right lower extremity: Secondary | ICD-10-CM

## 2022-12-13 ENCOUNTER — Ambulatory Visit: Payer: PPO | Admitting: Family Medicine

## 2022-12-21 ENCOUNTER — Encounter: Payer: Self-pay | Admitting: Family Medicine

## 2022-12-28 ENCOUNTER — Ambulatory Visit: Payer: PPO | Admitting: Physician Assistant

## 2022-12-28 ENCOUNTER — Ambulatory Visit (HOSPITAL_COMMUNITY)
Admission: RE | Admit: 2022-12-28 | Discharge: 2022-12-28 | Disposition: A | Discharge status: Home / Self Care | Payer: PPO | Source: Ambulatory Visit | Attending: Vascular Surgery | Admitting: Vascular Surgery

## 2022-12-28 VITALS — BP 153/80 | HR 68 | Temp 96.9°F | Resp 18 | Ht 64.0 in | Wt 136.9 lb

## 2022-12-28 DIAGNOSIS — I8391 Asymptomatic varicose veins of right lower extremity: Secondary | ICD-10-CM | POA: Insufficient documentation

## 2022-12-28 DIAGNOSIS — I872 Venous insufficiency (chronic) (peripheral): Secondary | ICD-10-CM
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
## 2023-01-04 DIAGNOSIS — M4721 Other spondylosis with radiculopathy, occipito-atlanto-axial region: Secondary | ICD-10-CM | POA: Diagnosis not present

## 2023-01-04 DIAGNOSIS — M25551 Pain in right hip: Secondary | ICD-10-CM | POA: Diagnosis not present

## 2023-01-04 DIAGNOSIS — M9904 Segmental and somatic dysfunction of sacral region: Secondary | ICD-10-CM | POA: Diagnosis not present

## 2023-01-04 DIAGNOSIS — M47816 Spondylosis without myelopathy or radiculopathy, lumbar region: Secondary | ICD-10-CM | POA: Diagnosis not present

## 2023-01-04 DIAGNOSIS — M9906 Segmental and somatic dysfunction of lower extremity: Secondary | ICD-10-CM | POA: Diagnosis not present

## 2023-01-04 DIAGNOSIS — M9901 Segmental and somatic dysfunction of cervical region: Secondary | ICD-10-CM | POA: Diagnosis not present

## 2023-01-04 DIAGNOSIS — M47818 Spondylosis without myelopathy or radiculopathy, sacral and sacrococcygeal region: Secondary | ICD-10-CM | POA: Diagnosis not present

## 2023-01-04 DIAGNOSIS — M25552 Pain in left hip: Secondary | ICD-10-CM | POA: Diagnosis not present

## 2023-01-04 DIAGNOSIS — M9903 Segmental and somatic dysfunction of lumbar region: Secondary | ICD-10-CM | POA: Diagnosis not present

## 2023-01-05 DIAGNOSIS — M4721 Other spondylosis with radiculopathy, occipito-atlanto-axial region: Secondary | ICD-10-CM | POA: Diagnosis not present

## 2023-01-05 DIAGNOSIS — M25552 Pain in left hip: Secondary | ICD-10-CM | POA: Diagnosis not present

## 2023-01-05 DIAGNOSIS — M9901 Segmental and somatic dysfunction of cervical region: Secondary | ICD-10-CM | POA: Diagnosis not present

## 2023-01-05 DIAGNOSIS — M47816 Spondylosis without myelopathy or radiculopathy, lumbar region: Secondary | ICD-10-CM | POA: Diagnosis not present

## 2023-01-05 DIAGNOSIS — M9904 Segmental and somatic dysfunction of sacral region: Secondary | ICD-10-CM | POA: Diagnosis not present

## 2023-01-05 DIAGNOSIS — M25551 Pain in right hip: Secondary | ICD-10-CM | POA: Diagnosis not present

## 2023-01-05 DIAGNOSIS — M47818 Spondylosis without myelopathy or radiculopathy, sacral and sacrococcygeal region: Secondary | ICD-10-CM | POA: Diagnosis not present

## 2023-01-05 DIAGNOSIS — M9906 Segmental and somatic dysfunction of lower extremity: Secondary | ICD-10-CM | POA: Diagnosis not present

## 2023-01-05 DIAGNOSIS — M9903 Segmental and somatic dysfunction of lumbar region: Secondary | ICD-10-CM | POA: Diagnosis not present

## 2023-01-24 ENCOUNTER — Encounter: Payer: Self-pay | Admitting: Physician Assistant

## 2023-01-24 ENCOUNTER — Other Ambulatory Visit (HOSPITAL_BASED_OUTPATIENT_CLINIC_OR_DEPARTMENT_OTHER): Payer: Self-pay

## 2023-01-24 ENCOUNTER — Telehealth: Payer: PPO | Admitting: Physician Assistant

## 2023-01-24 ENCOUNTER — Encounter: Payer: Self-pay | Admitting: Family Medicine

## 2023-01-24 DIAGNOSIS — J069 Acute upper respiratory infection, unspecified: Secondary | ICD-10-CM

## 2023-01-24 DIAGNOSIS — U071 COVID-19: Secondary | ICD-10-CM

## 2023-01-24 MED ORDER — NIRMATRELVIR/RITONAVIR (PAXLOVID)TABLET
3.0000 | ORAL_TABLET | Freq: Two times a day (BID) | ORAL | 0 refills | Status: AC
Start: 2023-01-24 — End: 2023-01-29
  Filled 2023-01-24: qty 30, 5d supply, fill #0

## 2023-01-24 NOTE — Progress Notes (Signed)
    MyChart Video Visit    Virtual Visit via Video Note   This format is felt to be most appropriate for this patient at this time. Physical exam was limited by quality of the video and audio technology used for the visit.   Patient location: home Provider location: [facility]  I discussed the limitations of evaluation and management by telemedicine and the availability of in person appointments. The patient expressed understanding and agreed to proceed.  Patient: Shannon [name]   DOB: [birth year]   67 y.o. Female  MRN: [id number] Visit Date: 01/24/2023  Today's healthcare provider: Alfredia Ferguson, PA-C   Cc. covid  Subjective    HPI  Pt reports feeling sick starting Saturday x 2 days ago, aches, fatigues, then sore throat, nasal congestion, symptoms worsening today, + COVID test at home. Her husband recently caught COVID as well. Reports tmax 100.3 F today, denies sob.    Medications: Outpatient Medications Prior to Visit  Medication Sig   levocetirizine (XYZAL) 5 MG tablet Take 1 tablet (5 mg total) by mouth every evening.   temazepam (RESTORIL) 7.5 MG capsule Take 1 capsule (7.5 mg total) by mouth at bedtime as needed for sleep.   tiZANidine (ZANAFLEX) 2 MG tablet Take 0.5-2 tablets (1-4 mg total) by mouth every 8 (eight) hours as needed for muscle spasms.   tretinoin (RETIN-A) 0.025 % cream Apply 1 application in the evening to face externally, pea size amount to whole face at night 30   No facility-administered medications prior to visit.      Objective    There were no vitals taken for this visit.    Physical Exam Constitutional:      Appearance: Normal appearance. She is not ill-appearing.  Neurological:     Mental Status: She is oriented to person, place, and time.  Psychiatric:        Mood and Affect: Mood normal.        Behavior: Behavior normal.        Assessment & Plan     1. Upper respiratory tract infection due to COVID-19 virus Reviewed  labs, Rx paxlovid.  Recommending hydration, rest, tylenol, mucinex otc.  - nirmatrelvir/ritonavir (PAXLOVID) 20 x 150 MG & 10 x 100MG  TABS; Take 3 tablets by mouth 2 (two) times daily for 5 days. (Take nirmatrelvir 150 mg two tablets twice daily for 5 days and ritonavir 100 mg one tablet twice daily for 5 days)  Dispense: 30 tablet; Refill: 0   Return if symptoms worsen or fail to improve.     I discussed the assessment and treatment plan with the patient. The patient was provided an opportunity to ask questions and all were answered. The patient agreed with the plan and demonstrated an understanding of the instructions.   The patient was advised to call back or seek an in-person evaluation if the symptoms worsen or if the condition fails to improve as anticipated.  I provided 3 minutes of non-face-to-face time during this encounter. I, Alfredia Ferguson, PA-C have reviewed all documentation for this visit. The documentation on  01/24/23   for the exam, diagnosis, procedures, and orders are all accurate and complete.   Alfredia Ferguson, PA-C [facility] at [facility] [phone] (phone) [phone] (fax)  [facility]

## 2023-01-24 NOTE — Telephone Encounter (Signed)
Seeing Shannon [name]. For vv

## 2023-01-26 ENCOUNTER — Encounter: Payer: Self-pay | Admitting: Family Medicine

## 2023-01-26 ENCOUNTER — Other Ambulatory Visit: Payer: Self-pay | Admitting: Family Medicine

## 2023-01-26 MED ORDER — TEMAZEPAM 7.5 MG PO CAPS
7.5000 mg | ORAL_CAPSULE | Freq: Every evening | ORAL | 1 refills | Status: DC | PRN
  Filled 2023-01-26: qty 30, 30d supply, fill #0

## 2023-01-27 ENCOUNTER — Other Ambulatory Visit (HOSPITAL_BASED_OUTPATIENT_CLINIC_OR_DEPARTMENT_OTHER): Payer: Self-pay

## 2023-01-27 ENCOUNTER — Ambulatory Visit: Payer: PPO | Admitting: Family Medicine

## 2023-01-27 ENCOUNTER — Other Ambulatory Visit: Payer: Self-pay | Admitting: Family Medicine

## 2023-01-27 MED ORDER — TEMAZEPAM 7.5 MG PO CAPS: 7.5000 mg | ORAL_CAPSULE | Freq: Every evening | ORAL | 1 refills | Status: DC | PRN

## 2023-01-28 ENCOUNTER — Other Ambulatory Visit (HOSPITAL_BASED_OUTPATIENT_CLINIC_OR_DEPARTMENT_OTHER): Payer: Self-pay
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
## 2023-02-02 ENCOUNTER — Ambulatory Visit: Payer: PPO | Admitting: Family Medicine

## 2023-02-02 ENCOUNTER — Encounter: Payer: Self-pay | Admitting: Family Medicine

## 2023-02-02 ENCOUNTER — Ambulatory Visit (INDEPENDENT_AMBULATORY_CARE_PROVIDER_SITE_OTHER): Payer: PPO

## 2023-02-02 VITALS — BP 142/82 | HR 75 | Ht 64.0 in | Wt 138.0 lb

## 2023-02-02 DIAGNOSIS — M25562 Pain in left knee: Secondary | ICD-10-CM | POA: Diagnosis not present

## 2023-02-02 DIAGNOSIS — M1710 Unilateral primary osteoarthritis, unspecified knee: Secondary | ICD-10-CM

## 2023-02-02 DIAGNOSIS — M545 Low back pain, unspecified: Secondary | ICD-10-CM

## 2023-02-02 DIAGNOSIS — M419 Scoliosis, unspecified: Secondary | ICD-10-CM | POA: Diagnosis not present

## 2023-02-02 DIAGNOSIS — G8929 Other chronic pain: Secondary | ICD-10-CM

## 2023-02-02 DIAGNOSIS — M25561 Pain in right knee: Secondary | ICD-10-CM | POA: Diagnosis not present

## 2023-02-02 DIAGNOSIS — M47816 Spondylosis without myelopathy or radiculopathy, lumbar region: Secondary | ICD-10-CM | POA: Diagnosis not present

## 2023-02-02 DIAGNOSIS — M25551 Pain in right hip: Secondary | ICD-10-CM

## 2023-02-02 MED ORDER — HYALURONAN 88 MG/4ML IX SOSY
176.0000 mg | PREFILLED_SYRINGE | Freq: Once | INTRA_ARTICULAR | Status: AC
Start: 2023-02-02 — End: 2023-02-02
  Administered 2023-02-02: 176 mg via INTRA_ARTICULAR

## 2023-02-02 NOTE — Assessment & Plan Note (Signed)
Chronic problem with worsening symptoms.  Continues to increase activity.  Discussed icing regimen and home exercises discussed which activities to do and which ones to avoid.  Follow-up with me again in 6 to 8 weeks otherwise we will check in at that time and hope patient is making significant improvement.

## 2023-02-02 NOTE — Assessment & Plan Note (Signed)
Known scoliosis.  Would like to further evaluate x-rays of the back to see if there is any worsening degenerative scoliosis that could be contributing.  No other significant changes in management though until further imaging is needed.

## 2023-02-02 NOTE — Patient Instructions (Addendum)
Xray lumbar spine  Monovisc injections today See me again in 2-3 months

## 2023-03-24 ENCOUNTER — Other Ambulatory Visit (HOSPITAL_BASED_OUTPATIENT_CLINIC_OR_DEPARTMENT_OTHER): Payer: Self-pay

## 2023-03-24 ENCOUNTER — Other Ambulatory Visit: Payer: Self-pay

## 2023-04-05 ENCOUNTER — Ambulatory Visit: Payer: PPO | Admitting: Family Medicine
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
## 2023-04-06 ENCOUNTER — Ambulatory Visit: Payer: PPO | Admitting: Family Medicine

## 2023-04-06 ENCOUNTER — Other Ambulatory Visit (HOSPITAL_BASED_OUTPATIENT_CLINIC_OR_DEPARTMENT_OTHER): Payer: Self-pay

## 2023-04-06 ENCOUNTER — Encounter: Payer: Self-pay | Admitting: Family Medicine

## 2023-04-06 ENCOUNTER — Other Ambulatory Visit: Payer: Self-pay

## 2023-04-06 VITALS — BP 124/76 | HR 84 | Ht 64.0 in | Wt 138.0 lb

## 2023-04-06 DIAGNOSIS — M19012 Primary osteoarthritis, left shoulder: Secondary | ICD-10-CM

## 2023-04-06 DIAGNOSIS — M12812 Other specific arthropathies, not elsewhere classified, left shoulder: Secondary | ICD-10-CM

## 2023-04-06 MED ORDER — DULOXETINE HCL 20 MG PO CPEP
20.0000 mg | ORAL_CAPSULE | Freq: Every day | ORAL | 0 refills | Status: DC
  Filled 2023-04-06: qty 30, 30d supply, fill #0

## 2023-04-06 NOTE — Patient Instructions (Addendum)
Injections in shoulder today Cymbalta 20mg  See you again in Dec for knee injections

## 2023-04-06 NOTE — Assessment & Plan Note (Signed)
Significant swelling noted.  Given injection today.  Discussed icing regimen and home exercises.  Been a year since we have done any type of injections.  May need to consider the possibility of surgical intervention if any significant weakness.  Can do repeat injections more sooner if necessary.  Follow-up again in 2 to 3 months

## 2023-04-06 NOTE — Assessment & Plan Note (Addendum)
Repeat injection given today and tolerated the procedure well, discussed icing regimen and home exercises.  Discussed which activities to do and which ones to avoid.  Increase activity slowly.  Significant arthritic changes and swelling that we will need to monitor over the course of time.  Follow-up again in 2 to 3 months due to the arthritis everywhere else we also started patient on Cymbalta.  Warned of potential side effects.

## 2023-04-18 ENCOUNTER — Other Ambulatory Visit (HOSPITAL_BASED_OUTPATIENT_CLINIC_OR_DEPARTMENT_OTHER): Payer: Self-pay

## 2023-04-18 ENCOUNTER — Telehealth (INDEPENDENT_AMBULATORY_CARE_PROVIDER_SITE_OTHER): Payer: PPO | Admitting: Family Medicine

## 2023-04-18 ENCOUNTER — Other Ambulatory Visit: Payer: Self-pay

## 2023-04-18 VITALS — BP 124/76 | HR 84 | Ht 64.0 in | Wt 138.0 lb

## 2023-04-18 DIAGNOSIS — M170 Bilateral primary osteoarthritis of knee: Secondary | ICD-10-CM

## 2023-04-18 DIAGNOSIS — R002 Palpitations: Secondary | ICD-10-CM

## 2023-04-18 DIAGNOSIS — M25512 Pain in left shoulder: Secondary | ICD-10-CM | POA: Diagnosis not present

## 2023-04-18 DIAGNOSIS — M419 Scoliosis, unspecified: Secondary | ICD-10-CM | POA: Insufficient documentation

## 2023-04-18 DIAGNOSIS — G4700 Insomnia, unspecified: Secondary | ICD-10-CM

## 2023-04-18 DIAGNOSIS — E785 Hyperlipidemia, unspecified: Secondary | ICD-10-CM

## 2023-04-18 DIAGNOSIS — E559 Vitamin D deficiency, unspecified: Secondary | ICD-10-CM | POA: Diagnosis not present

## 2023-04-18 DIAGNOSIS — H93A9 Pulsatile tinnitus, unspecified ear: Secondary | ICD-10-CM

## 2023-04-18 MED ORDER — TEMAZEPAM 15 MG PO CAPS
15.0000 mg | ORAL_CAPSULE | Freq: Every evening | ORAL | 1 refills | Status: DC | PRN
  Filled 2023-04-18: qty 30, 30d supply, fill #0
  Filled 2023-07-05: qty 30, 30d supply, fill #1

## 2023-04-18 MED ORDER — TIZANIDINE HCL 2 MG PO TABS
1.0000 mg | ORAL_TABLET | Freq: Three times a day (TID) | ORAL | 2 refills | Status: DC | PRN
  Filled 2023-04-18: qty 30, 5d supply, fill #0
  Filled 2023-07-05: qty 30, 5d supply, fill #1

## 2023-04-18 NOTE — Assessment & Plan Note (Signed)
Using steroid and Monovisc shots with sports med

## 2023-04-18 NOTE — Assessment & Plan Note (Addendum)
Following with sports medicine, athritis has taken it's toll. She has had a couple of injections and they have been successful

## 2023-04-18 NOTE — Assessment & Plan Note (Addendum)
Encouraged good sleep hygiene such as dark, quiet room. No blue/green glowing lights such as computer screens in bedroom. No alcohol or stimulants in evening. Cut down on caffeine as able. Regular exercise is helpful but not just prior to bed time.  She notes with improved stress as she has cut back at work. Is only using the Restoril 7.5 mg a couple times a week. Most cost effective at [facility]. Due to cost effectiveness will increase back to 15 mg as it is only $2 at the [facility] pharmacy. Will send into pharmacy

## 2023-04-18 NOTE — Assessment & Plan Note (Signed)
Encourage heart healthy diet such as MIND or DASH diet, increase exercise, avoid trans fats, simple carbohydrates and processed foods, consider a krill or fish or flaxseed oil cap daily.  °
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
## 2023-04-18 NOTE — Assessment & Plan Note (Signed)
No recent flares 

## 2023-04-18 NOTE — Assessment & Plan Note (Addendum)
Xray recently showed progression over the past year but is still managing with Tizanidine and stretching. Is considering Duloxetine 20 mg daily will discuss with sports medicine.

## 2023-04-18 NOTE — Assessment & Plan Note (Signed)
Supplement and monitor, increase daily supplement by 1000 IU daily

## 2023-04-25 ENCOUNTER — Other Ambulatory Visit (INDEPENDENT_AMBULATORY_CARE_PROVIDER_SITE_OTHER): Payer: PPO

## 2023-04-25 DIAGNOSIS — R002 Palpitations: Secondary | ICD-10-CM

## 2023-04-25 DIAGNOSIS — E559 Vitamin D deficiency, unspecified: Secondary | ICD-10-CM

## 2023-04-25 DIAGNOSIS — M170 Bilateral primary osteoarthritis of knee: Secondary | ICD-10-CM

## 2023-04-25 DIAGNOSIS — E785 Hyperlipidemia, unspecified: Secondary | ICD-10-CM | POA: Diagnosis not present

## 2023-04-25 LAB — LIPID PANEL
Cholesterol: 242 mg/dL — ABNORMAL HIGH (ref 0–200)
HDL: 64.3 mg/dL (ref >39.00)
LDL Cholesterol: 158 mg/dL — ABNORMAL HIGH (ref 0–99)
NonHDL: 177.41
Total CHOL/HDL Ratio: 4
Triglycerides: 98 mg/dL (ref 0.0–149.0)
VLDL: 19.6 mg/dL (ref 0.0–40.0)

## 2023-04-25 LAB — CBC WITH DIFFERENTIAL/PLATELET
Basophils Absolute: 0 10*3/uL (ref 0.0–0.1)
Basophils Relative: 1 % (ref 0.0–3.0)
Eosinophils Absolute: 0.1 10*3/uL (ref 0.0–0.7)
Eosinophils Relative: 1.5 % (ref 0.0–5.0)
HCT: 38.5 % (ref 36.0–46.0)
Hemoglobin: 12.9 g/dL (ref 12.0–15.0)
Lymphocytes Relative: 36.9 % (ref 12.0–46.0)
Lymphs Abs: 1.4 10*3/uL (ref 0.7–4.0)
MCHC: 33.4 g/dL (ref 30.0–36.0)
MCV: 98.2 fL (ref 78.0–100.0)
Monocytes Absolute: 0.4 10*3/uL (ref 0.1–1.0)
Monocytes Relative: 10.1 % (ref 3.0–12.0)
Neutro Abs: 1.9 10*3/uL (ref 1.4–7.7)
Neutrophils Relative %: 50.5 % (ref 43.0–77.0)
Platelets: 150 10*3/uL (ref 150.0–400.0)
RBC: 3.92 Mil/uL (ref 3.87–5.11)
RDW: 13.6 % (ref 11.5–15.5)
WBC: 3.8 10*3/uL — ABNORMAL LOW (ref 4.0–10.5)

## 2023-04-25 LAB — COMPREHENSIVE METABOLIC PANEL
ALT: 19 U/L (ref 0–35)
AST: 21 U/L (ref 0–37)
Albumin: 4.3 g/dL (ref 3.5–5.2)
Alkaline Phosphatase: 33 U/L — ABNORMAL LOW (ref 39–117)
BUN: 10 mg/dL (ref 6–23)
CO2: 30 meq/L (ref 19–32)
Calcium: 9.3 mg/dL (ref 8.4–10.5)
Chloride: 99 meq/L (ref 96–112)
Creatinine, Ser: 0.76 mg/dL (ref 0.40–1.20)
GFR: 80.38 mL/min (ref >60.00)
Glucose, Bld: 80 mg/dL (ref 70–99)
Potassium: 3.9 meq/L (ref 3.5–5.1)
Sodium: 136 meq/L (ref 135–145)
Total Bilirubin: 0.7 mg/dL (ref 0.2–1.2)
Total Protein: 6.6 g/dL (ref 6.0–8.3)

## 2023-04-25 LAB — TSH: TSH: 2.1 u[IU]/mL (ref 0.35–5.50)

## 2023-04-25 LAB — VITAMIN D 25 HYDROXY (VIT D DEFICIENCY, FRACTURES): VITD: 27.3 ng/mL — ABNORMAL LOW (ref 30.00–100.00)

## 2023-04-28 ENCOUNTER — Encounter: Payer: Self-pay | Admitting: Family Medicine

## 2023-04-28 ENCOUNTER — Other Ambulatory Visit: Payer: Self-pay | Admitting: Family Medicine

## 2023-04-28 DIAGNOSIS — E2839 Other primary ovarian failure: Secondary | ICD-10-CM

## 2023-04-28 DIAGNOSIS — Z780 Asymptomatic menopausal state: Secondary | ICD-10-CM

## 2023-05-03 NOTE — Progress Notes (Deleted)
    Aleen Sells D.[facility] [address] [place] [address] Phone: [phone]   Assessment and Plan:     There are no diagnoses linked to this encounter.  ***   Pertinent previous records reviewed include ***    Follow Up: ***     Subjective:   I, Itzayana Pardy, am serving as a Neurosurgeon for Doctor Richardean Sale  Chief Complaint: low back pain   HPI:   05/04/2023 Patient is a 67 year old female with concerns of low back pain. Patient states  Relevant Historical Information: ***  Additional pertinent review of systems negative.   Current Outpatient Medications:    DULoxetine (CYMBALTA) 20 MG capsule, Take 1 capsule (20 mg total) by mouth daily., Disp: 30 capsule, Rfl: 0   levocetirizine (XYZAL) 5 MG tablet, Take 1 tablet (5 mg total) by mouth every evening., Disp: 30 tablet, Rfl: 2   temazepam (RESTORIL) 15 MG capsule, Take 1 capsule (15 mg total) by mouth at bedtime as needed for sleep., Disp: 30 capsule, Rfl: 1   tiZANidine (ZANAFLEX) 2 MG tablet, Take 0.5-2 tablets (1-4 mg total) by mouth every 8 (eight) hours as needed for muscle spasms., Disp: 30 tablet, Rfl: 2   tretinoin (RETIN-A) 0.025 % cream, Apply 1 application in the evening to face externally, pea size amount to whole face at night 30, Disp: 45 g, Rfl: 1   Objective:     There were no vitals filed for this visit.    There is no height or weight on file to calculate BMI.    Physical Exam:    ***   Electronically signed by:  Aleen Sells [facility] 7:39 AM 05/03/23

## 2023-05-04 ENCOUNTER — Ambulatory Visit: Payer: PPO | Admitting: Sports Medicine

## 2023-05-06 ENCOUNTER — Other Ambulatory Visit (HOSPITAL_BASED_OUTPATIENT_CLINIC_OR_DEPARTMENT_OTHER): Payer: Self-pay

## 2023-05-06 MED ORDER — ZOSTER VAC RECOMB ADJUVANTED 50 MCG/0.5ML IM SUSR
0.5000 mL | Freq: Once | INTRAMUSCULAR | 0 refills | Status: AC
  Filled 2023-05-06: qty 0.5, 1d supply, fill #0

## 2023-05-08 DIAGNOSIS — H43812 Vitreous degeneration, left eye: Secondary | ICD-10-CM | POA: Diagnosis not present

## 2023-05-08 DIAGNOSIS — H4312 Vitreous hemorrhage, left eye: Secondary | ICD-10-CM | POA: Diagnosis not present

## 2023-05-11 ENCOUNTER — Ambulatory Visit (HOSPITAL_BASED_OUTPATIENT_CLINIC_OR_DEPARTMENT_OTHER)
Admission: RE | Admit: 2023-05-11 | Discharge: 2023-05-11 | Disposition: A | Discharge status: Home / Self Care | Payer: PPO | Source: Ambulatory Visit | Attending: Family Medicine | Admitting: Family Medicine

## 2023-05-11 ENCOUNTER — Encounter (HOSPITAL_BASED_OUTPATIENT_CLINIC_OR_DEPARTMENT_OTHER): Payer: Self-pay

## 2023-05-11 DIAGNOSIS — H93A9 Pulsatile tinnitus, unspecified ear: Secondary | ICD-10-CM | POA: Insufficient documentation

## 2023-05-11 DIAGNOSIS — H93A2 Pulsatile tinnitus, left ear: Secondary | ICD-10-CM | POA: Diagnosis not present

## 2023-05-15 DIAGNOSIS — H43819 Vitreous degeneration, unspecified eye: Secondary | ICD-10-CM

## 2023-05-15 HISTORY — DX: Vitreous degeneration, unspecified eye: H43.819
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
## 2023-05-24 ENCOUNTER — Other Ambulatory Visit (HOSPITAL_BASED_OUTPATIENT_CLINIC_OR_DEPARTMENT_OTHER): Payer: Self-pay

## 2023-05-24 ENCOUNTER — Ambulatory Visit: Payer: PPO | Admitting: Family Medicine

## 2023-05-24 ENCOUNTER — Other Ambulatory Visit: Payer: Self-pay

## 2023-05-24 ENCOUNTER — Encounter: Payer: Self-pay | Admitting: Family Medicine

## 2023-05-24 VITALS — BP 132/84 | HR 78 | Ht 64.0 in | Wt 135.0 lb

## 2023-05-24 DIAGNOSIS — M25512 Pain in left shoulder: Secondary | ICD-10-CM

## 2023-05-24 DIAGNOSIS — G8929 Other chronic pain: Secondary | ICD-10-CM | POA: Diagnosis not present

## 2023-05-24 DIAGNOSIS — M545 Low back pain, unspecified: Secondary | ICD-10-CM | POA: Diagnosis not present

## 2023-05-24 DIAGNOSIS — M1710 Unilateral primary osteoarthritis, unspecified knee: Secondary | ICD-10-CM | POA: Diagnosis not present

## 2023-05-24 MED ORDER — GABAPENTIN 100 MG PO CAPS
200.0000 mg | ORAL_CAPSULE | Freq: Every day | ORAL | 0 refills | Status: DC
  Filled 2023-05-24: qty 180, 90d supply, fill #0

## 2023-05-24 NOTE — Assessment & Plan Note (Signed)
Worsening low back pain Patient did not respond well to when taking the Cymbalta.  Started on gabapentin 100 mg.  Hopefully the patient will get better with this.  Will continue to stay active otherwise.  Follow-up again in 12 weeks

## 2023-05-24 NOTE — Patient Instructions (Addendum)
Injected both knees today See me again in 6 weeks See me in 3 months for gel.

## 2023-05-24 NOTE — Assessment & Plan Note (Signed)
Chronic problems worsening symptoms.  Still wants to avoid any surgical intervention if possible.  Discussed icing regimen of home exercises, discussed which activities to do and which ones to avoid.  Increase activity slowly.  Follow-up again in 6 to 8 weeks otherwise.

## 2023-06-02 ENCOUNTER — Ambulatory Visit (HOSPITAL_BASED_OUTPATIENT_CLINIC_OR_DEPARTMENT_OTHER)
Admission: RE | Admit: 2023-06-02 | Discharge: 2023-06-02 | Disposition: A | Discharge status: Home / Self Care | Payer: PPO | Source: Ambulatory Visit | Attending: Family Medicine | Admitting: Family Medicine

## 2023-06-02 DIAGNOSIS — Z780 Asymptomatic menopausal state: Secondary | ICD-10-CM | POA: Insufficient documentation

## 2023-06-02 DIAGNOSIS — E2839 Other primary ovarian failure: Secondary | ICD-10-CM | POA: Insufficient documentation

## 2023-06-02 DIAGNOSIS — M85852 Other specified disorders of bone density and structure, left thigh: Secondary | ICD-10-CM | POA: Diagnosis not present

## 2023-06-10 DIAGNOSIS — H43812 Vitreous degeneration, left eye: Secondary | ICD-10-CM | POA: Diagnosis not present

## 2023-07-05 ENCOUNTER — Other Ambulatory Visit: Payer: Self-pay

## 2023-07-07 ENCOUNTER — Other Ambulatory Visit (HOSPITAL_BASED_OUTPATIENT_CLINIC_OR_DEPARTMENT_OTHER): Payer: Self-pay

## 2023-07-07 ENCOUNTER — Ambulatory Visit
Admission: EM | Admit: 2023-07-07 | Discharge: 2023-07-07 | Disposition: A | Discharge status: Home / Self Care | Payer: PPO | Attending: Emergency Medicine | Admitting: Emergency Medicine

## 2023-07-07 ENCOUNTER — Other Ambulatory Visit: Payer: Self-pay | Admitting: Family Medicine

## 2023-07-07 DIAGNOSIS — M62830 Muscle spasm of back: Secondary | ICD-10-CM

## 2023-07-07 DIAGNOSIS — M545 Low back pain, unspecified: Secondary | ICD-10-CM

## 2023-07-07 MED ORDER — CYCLOBENZAPRINE HCL 10 MG PO TABS
10.0000 mg | ORAL_TABLET | Freq: Two times a day (BID) | ORAL | 0 refills | Status: DC | PRN
  Filled 2023-07-07: qty 20, 10d supply, fill #0

## 2023-07-07 MED ORDER — PREDNISONE 20 MG PO TABS
40.0000 mg | ORAL_TABLET | Freq: Every day | ORAL | 0 refills | Status: DC
  Filled 2023-07-07: qty 10, 5d supply, fill #0

## 2023-07-07 MED ORDER — COVID-19 MRNA VAC-TRIS(PFIZER) 30 MCG/0.3ML IM SUSY
0.3000 mL | PREFILLED_SYRINGE | Freq: Once | INTRAMUSCULAR | 0 refills | Status: AC
  Filled 2023-07-07 (×2): qty 0.3, 1d supply, fill #0

## 2023-07-07 NOTE — ED Triage Notes (Addendum)
Pt c/o lower RT sided back pain x 2 days. More painful while walking. Zanaflex and aleve prn. Has f/u with ortho tomorrow.

## 2023-07-07 NOTE — Discharge Instructions (Signed)
You can take the muscle relaxer twice daily. If the medication makes you drowsy, take only at bed time.  Call orthopedic specialist; if they give the OK, take prednisone 40 mg daily x 5 days  Aleve (naproxen) you can take up to 500 mg twice daily

## 2023-07-07 NOTE — Telephone Encounter (Signed)
Pharmacy comment: Discontinued by PA at urgent care when tizan. prescribed. Pt would like this for when that short-term med is finished
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
## 2023-07-08 ENCOUNTER — Encounter: Payer: Self-pay | Admitting: Family Medicine

## 2023-07-08 ENCOUNTER — Other Ambulatory Visit (HOSPITAL_BASED_OUTPATIENT_CLINIC_OR_DEPARTMENT_OTHER): Payer: Self-pay

## 2023-07-08 ENCOUNTER — Ambulatory Visit: Payer: HMO | Admitting: Family Medicine

## 2023-07-08 VITALS — BP 132/88 | HR 65 | Ht 64.0 in | Wt 137.0 lb

## 2023-07-08 DIAGNOSIS — M419 Scoliosis, unspecified: Secondary | ICD-10-CM | POA: Diagnosis not present

## 2023-07-08 DIAGNOSIS — M545 Low back pain, unspecified: Secondary | ICD-10-CM

## 2023-07-08 DIAGNOSIS — M170 Bilateral primary osteoarthritis of knee: Secondary | ICD-10-CM | POA: Diagnosis not present

## 2023-07-08 MED ORDER — METHYLPREDNISOLONE ACETATE 80 MG/ML IJ SUSP
80.0000 mg | Freq: Once | INTRAMUSCULAR | Status: AC
  Administered 2023-07-08: 80 mg via INTRAMUSCULAR

## 2023-07-08 MED ORDER — HYALURONAN 88 MG/4ML IX SOSY
176.0000 mg | PREFILLED_SYRINGE | Freq: Once | INTRA_ARTICULAR | Status: AC
  Administered 2023-07-08: 176 mg via INTRA_ARTICULAR

## 2023-07-08 MED ORDER — PREDNISONE 20 MG PO TABS
40.0000 mg | ORAL_TABLET | Freq: Every day | ORAL | 0 refills | Status: DC
  Filled 2023-07-08 – 2023-07-25 (×2): qty 14, 7d supply, fill #0

## 2023-07-08 MED ORDER — KETOROLAC TROMETHAMINE 60 MG/2ML IM SOLN
60.0000 mg | Freq: Once | INTRAMUSCULAR | Status: AC
  Administered 2023-07-08: 60 mg via INTRAMUSCULAR

## 2023-07-08 MED ORDER — TRAMADOL HCL 50 MG PO TABS
50.0000 mg | ORAL_TABLET | Freq: Three times a day (TID) | ORAL | 0 refills | Status: AC | PRN
  Filled 2023-07-08: qty 15, 5d supply, fill #0

## 2023-07-08 NOTE — Patient Instructions (Addendum)
Prednisone 40mg  for 7 days Injections in backside today Monovisc for both knees today Tramadol for worsening pain See me again in 2 months

## 2023-07-08 NOTE — Assessment & Plan Note (Signed)
Has responded well to the injections previously.  Hopeful that this will make a significant difference again.  Discussed icing regimen and home exercises.  Follow-up again in 6 to 8 weeks
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
## 2023-07-08 NOTE — Assessment & Plan Note (Signed)
Scoliosis noted.  Discussed icing regimen and home exercises, discussed which activities to do and which ones to avoid.  Increase activity slowly.  Toradol and Depo-Medrol given today which I think will make a significant improvement in this individual.  Has the prednisone that she can take for 5 days.  Given her another dose to take on her trip.  Tramadol for breakthrough pain and discussed the synergistic effect with Tylenol.  Will order MRI with patient having more exacerbations to the point that is affecting quality of life as well as waking her at night.  Depending on MRI see if she is a candidate for other treatment options such as epidurals.  Follow-up again when patient comes back from her international trip.

## 2023-07-12 ENCOUNTER — Ambulatory Visit
Admission: RE | Admit: 2023-07-12 | Discharge: 2023-07-12 | Disposition: A | Discharge status: Home / Self Care | Payer: HMO | Source: Ambulatory Visit | Attending: Family Medicine | Admitting: Family Medicine

## 2023-07-12 DIAGNOSIS — M47816 Spondylosis without myelopathy or radiculopathy, lumbar region: Secondary | ICD-10-CM | POA: Diagnosis not present

## 2023-07-12 DIAGNOSIS — M4316 Spondylolisthesis, lumbar region: Secondary | ICD-10-CM | POA: Diagnosis not present

## 2023-07-12 DIAGNOSIS — M5126 Other intervertebral disc displacement, lumbar region: Secondary | ICD-10-CM | POA: Diagnosis not present

## 2023-07-12 DIAGNOSIS — M48061 Spinal stenosis, lumbar region without neurogenic claudication: Secondary | ICD-10-CM | POA: Diagnosis not present

## 2023-07-12 DIAGNOSIS — M545 Low back pain, unspecified: Secondary | ICD-10-CM

## 2023-07-13 ENCOUNTER — Encounter: Payer: Self-pay | Admitting: Family Medicine

## 2023-07-13 ENCOUNTER — Other Ambulatory Visit: Payer: Self-pay | Admitting: Family Medicine

## 2023-07-13 MED ORDER — ALPRAZOLAM 0.25 MG PO TABS
0.2500 mg | ORAL_TABLET | Freq: Two times a day (BID) | ORAL | 1 refills | Status: DC | PRN
Start: 2023-07-13 — End: 2024-01-27
  Filled 2023-07-13: qty 20, 10d supply, fill #0
  Filled 2023-10-26: qty 20, 10d supply, fill #1

## 2023-07-13 MED ORDER — TIZANIDINE HCL 2 MG PO TABS
1.0000 mg | ORAL_TABLET | Freq: Three times a day (TID) | ORAL | 2 refills | Status: DC | PRN
  Filled 2023-07-13: qty 30, 5d supply, fill #0
  Filled 2023-08-15: qty 30, 5d supply, fill #1
  Filled 2023-11-04: qty 30, 5d supply, fill #2

## 2023-07-14 ENCOUNTER — Other Ambulatory Visit (HOSPITAL_BASED_OUTPATIENT_CLINIC_OR_DEPARTMENT_OTHER): Payer: Self-pay

## 2023-07-14 ENCOUNTER — Other Ambulatory Visit: Payer: Self-pay

## 2023-07-14 ENCOUNTER — Other Ambulatory Visit: Payer: Self-pay | Admitting: Family Medicine

## 2023-07-14 MED ORDER — SCOPOLAMINE 1 MG/3DAYS TD PT72
1.0000 | MEDICATED_PATCH | TRANSDERMAL | 0 refills | Status: DC
  Filled 2023-07-14: qty 6, 18d supply, fill #0

## 2023-07-18 ENCOUNTER — Encounter: Payer: Self-pay | Admitting: Family Medicine

## 2023-07-19 ENCOUNTER — Other Ambulatory Visit: Payer: Self-pay | Admitting: Emergency Medicine

## 2023-07-19 ENCOUNTER — Encounter: Payer: Self-pay | Admitting: Family Medicine

## 2023-07-19 ENCOUNTER — Other Ambulatory Visit: Payer: Self-pay

## 2023-07-19 ENCOUNTER — Telehealth: Payer: Self-pay | Admitting: Family Medicine

## 2023-07-19 DIAGNOSIS — Z01812 Encounter for preprocedural laboratory examination: Secondary | ICD-10-CM

## 2023-07-19 DIAGNOSIS — M5416 Radiculopathy, lumbar region: Secondary | ICD-10-CM

## 2023-07-19 DIAGNOSIS — Z01810 Encounter for preprocedural cardiovascular examination: Secondary | ICD-10-CM

## 2023-07-19 NOTE — Telephone Encounter (Signed)
Called and spoke with patient. She has an in office visit (08/18/2023) and a lab appointment has been made.

## 2023-07-19 NOTE — Telephone Encounter (Signed)
Copied from CRM [id number]. Topic: General - Call Back - No Documentation >> Jul 19, 2023  8:55 AM [place] M wrote: Reason for CRM: Patient returning call from [place].. Please call back

## 2023-07-19 NOTE — Telephone Encounter (Signed)
Called patient. All appointments were made for pre op visit

## 2023-07-20 ENCOUNTER — Ambulatory Visit
Admission: RE | Admit: 2023-07-20 | Discharge: 2023-07-20 | Disposition: A | Discharge status: Home / Self Care | Payer: HMO | Source: Ambulatory Visit | Attending: Family Medicine | Admitting: Family Medicine

## 2023-07-20 DIAGNOSIS — M4727 Other spondylosis with radiculopathy, lumbosacral region: Secondary | ICD-10-CM | POA: Diagnosis not present

## 2023-07-20 DIAGNOSIS — M5416 Radiculopathy, lumbar region: Secondary | ICD-10-CM

## 2023-07-20 MED ORDER — METHYLPREDNISOLONE ACETATE 40 MG/ML INJ SUSP (RADIOLOG
80.0000 mg | Freq: Once | INTRAMUSCULAR | Status: AC
  Administered 2023-07-20: 80 mg via EPIDURAL

## 2023-07-20 MED ORDER — IOPAMIDOL (ISOVUE-M 200) INJECTION 41%
1.0000 mL | Freq: Once | INTRAMUSCULAR | Status: AC
Start: 2023-07-20 — End: 2023-07-20
  Administered 2023-07-20: 1 mL via EPIDURAL

## 2023-07-20 NOTE — Discharge Instructions (Signed)

## 2023-07-22 ENCOUNTER — Other Ambulatory Visit (INDEPENDENT_AMBULATORY_CARE_PROVIDER_SITE_OTHER): Payer: HMO

## 2023-07-22 DIAGNOSIS — Z01812 Encounter for preprocedural laboratory examination: Secondary | ICD-10-CM | POA: Diagnosis not present

## 2023-07-22 LAB — CBC WITH DIFFERENTIAL/PLATELET
Basophils Absolute: 0 10*3/uL (ref 0.0–0.1)
Basophils Relative: 0.5 % (ref 0.0–3.0)
Eosinophils Absolute: 0.1 10*3/uL (ref 0.0–0.7)
Eosinophils Relative: 1.9 % (ref 0.0–5.0)
HCT: 36.2 % (ref 36.0–46.0)
Hemoglobin: 12.4 g/dL (ref 12.0–15.0)
Lymphocytes Relative: 28.3 % (ref 12.0–46.0)
Lymphs Abs: 1.2 10*3/uL (ref 0.7–4.0)
MCHC: 34.1 g/dL (ref 30.0–36.0)
MCV: 97.4 fL (ref 78.0–100.0)
Monocytes Absolute: 0.4 10*3/uL (ref 0.1–1.0)
Monocytes Relative: 9.9 % (ref 3.0–12.0)
Neutro Abs: 2.5 10*3/uL (ref 1.4–7.7)
Neutrophils Relative %: 59.4 % (ref 43.0–77.0)
Platelets: 168 10*3/uL (ref 150.0–400.0)
RBC: 3.72 Mil/uL — ABNORMAL LOW (ref 3.87–5.11)
RDW: 13.7 % (ref 11.5–15.5)
WBC: 4.2 10*3/uL (ref 4.0–10.5)

## 2023-07-22 LAB — PROTIME-INR
INR: 1.1 {ratio} — ABNORMAL HIGH (ref 0.8–1.0)
Prothrombin Time: 11.4 s (ref 9.6–13.1)

## 2023-07-22 LAB — COMPREHENSIVE METABOLIC PANEL
ALT: 30 U/L (ref 0–35)
AST: 25 U/L (ref 0–37)
Albumin: 4.3 g/dL (ref 3.5–5.2)
Alkaline Phosphatase: 50 U/L (ref 39–117)
BUN: 11 mg/dL (ref 6–23)
CO2: 29 meq/L (ref 19–32)
Calcium: 9 mg/dL (ref 8.4–10.5)
Chloride: 99 meq/L (ref 96–112)
Creatinine, Ser: 0.65 mg/dL (ref 0.40–1.20)
GFR: 90.16 mL/min (ref >60.00)
Glucose, Bld: 94 mg/dL (ref 70–99)
Potassium: 4.1 meq/L (ref 3.5–5.1)
Sodium: 136 meq/L (ref 135–145)
Total Bilirubin: 0.5 mg/dL (ref 0.2–1.2)
Total Protein: 6.9 g/dL (ref 6.0–8.3)

## 2023-07-22 LAB — APTT: aPTT: 33.2 s (ref 25.4–36.8)

## 2023-07-25 ENCOUNTER — Other Ambulatory Visit (HOSPITAL_BASED_OUTPATIENT_CLINIC_OR_DEPARTMENT_OTHER): Payer: Self-pay

## 2023-07-25 ENCOUNTER — Encounter: Payer: Self-pay | Admitting: Family Medicine

## 2023-07-27 ENCOUNTER — Other Ambulatory Visit (HOSPITAL_BASED_OUTPATIENT_CLINIC_OR_DEPARTMENT_OTHER): Payer: Self-pay

## 2023-07-29 DIAGNOSIS — M5432 Sciatica, left side: Secondary | ICD-10-CM | POA: Diagnosis not present

## 2023-08-01 DIAGNOSIS — M5432 Sciatica, left side: Secondary | ICD-10-CM | POA: Diagnosis not present

## 2023-08-04 DIAGNOSIS — M5432 Sciatica, left side: Secondary | ICD-10-CM | POA: Diagnosis not present

## 2023-08-08 ENCOUNTER — Telehealth: Payer: Self-pay | Admitting: Family Medicine

## 2023-08-08 DIAGNOSIS — M5432 Sciatica, left side: Secondary | ICD-10-CM | POA: Diagnosis not present

## 2023-08-08 NOTE — Telephone Encounter (Signed)
 Patient called to let Dr.Smith know that she is currently traveling and experiencing some left sided groin pain radiating into pelvis now. She is seeing Doctor on her cruise but she is wondering if pain is normal. She had epidural before she left for her trip. Patient states it is usually their right side that hurts and now it is her left side. Patient states walking is difficult. Patient would like Dr. Michaelle Copas opinion on what he thinks it is. She will be home in about 4 days but wants to get his opinion.  -can not access mychart right now so a phone call would be best.
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
## 2023-08-10 NOTE — Telephone Encounter (Signed)
 Sent patient MyChart message.

## 2023-08-14 NOTE — Assessment & Plan Note (Signed)
 Encourage heart healthy diet such as MIND or DASH diet, increase exercise, avoid trans fats, simple carbohydrates and processed foods, consider a krill or fish or flaxseed oil cap daily.

## 2023-08-14 NOTE — Assessment & Plan Note (Signed)
 Encouraged good sleep hygiene such as dark, quiet room. No blue/green glowing lights such as computer screens in bedroom. No alcohol or stimulants in evening. Cut down on caffeine as able. Regular exercise is helpful but not just prior to bed time.

## 2023-08-15 ENCOUNTER — Ambulatory Visit: Admitting: Family Medicine

## 2023-08-15 ENCOUNTER — Ambulatory Visit (INDEPENDENT_AMBULATORY_CARE_PROVIDER_SITE_OTHER)

## 2023-08-15 ENCOUNTER — Other Ambulatory Visit (HOSPITAL_BASED_OUTPATIENT_CLINIC_OR_DEPARTMENT_OTHER): Payer: Self-pay

## 2023-08-15 ENCOUNTER — Other Ambulatory Visit: Payer: Self-pay

## 2023-08-15 VITALS — BP 188/104 | HR 96 | Ht 64.0 in

## 2023-08-15 DIAGNOSIS — S32512A Fracture of superior rim of left pubis, initial encounter for closed fracture: Secondary | ICD-10-CM | POA: Diagnosis not present

## 2023-08-15 DIAGNOSIS — M25552 Pain in left hip: Secondary | ICD-10-CM

## 2023-08-15 MED ORDER — HYDROCODONE-ACETAMINOPHEN 5-325 MG PO TABS
1.0000 | ORAL_TABLET | Freq: Four times a day (QID) | ORAL | 0 refills | Status: DC | PRN
  Filled 2023-08-15: qty 15, 4d supply, fill #0

## 2023-08-15 MED ORDER — AMBULATORY NON FORMULARY MEDICATION: 0 refills | Status: DC

## 2023-08-15 NOTE — Progress Notes (Signed)
 Shannon Payor, PhD, LAT, ATC acting as a scribe for [name], MD.  Shannon [name] is a 68 y.o. female who presents to [facility] today for hip pain. Pt was previously seen by Dr. Katrinka Blazing on 07/08/23 bilat knee pain and lumbar radiculopathy.   Today, pt c/o L hip pain ongoing since the end of Feb. Pain started while she was on a trip to [place]. Pt had a lumbar ESI on 07/20/23. She noticed the pain after the flight. Pt locates pain to the L groin and throughout her pelvis. She described the sensation of a "heaviness" in her pelvis.  She denies any injury.  Radiates: no Aggravates: trying to get out of bed, walking, transitioning to stand, figure-4 Treatments tried: Toradol, Tylenol, IBU  Dx imaging: 07/12/23 L-spine MRI  02/02/23 L-spine XR  Pertinent review of systems: No fevers or chills  Relevant historical information: Osteopenia on recent DEXA scan.   Exam:  BP (!) 188/104   Pulse 96   Ht 5\' 4"  (1.626 m)   SpO2 98%   BMI 23.52 kg/m  General: Well Developed, well nourished, and in no acute distress.   MSK: Left hip decreased range of motion.  Antalgic gait.    Lab and Radiology Results No results found for this or any previous visit (from the past 72 hours). DG HIP UNILAT W OR W/O PELVIS 2-3 VIEWS LEFT Result Date: 08/15/2023 CLINICAL DATA:  Left groin pain for the past 3 weeks after being on a cruise. EXAM: DG HIP (WITH OR WITHOUT PELVIS) 2-3V LEFT COMPARISON:  Left hip x-rays dated May 18, 2016. FINDINGS: Acute mildly displaced fracture of the left parasymphyseal superior pubic ramus. No definite underlying lytic lesion. No additional fracture. No dislocation. The hip joint spaces are relatively preserved. The pubic symphysis and sacroiliac joints are intact. Soft tissues are unremarkable. IMPRESSION: 1. Acute mildly displaced fracture of the left parasymphyseal superior pubic ramus. This is likely an insufficiency fracture. Consider  further evaluation with MRI of the pelvis. These results were discussed by telephone on 08/15/2023 at 2:17 pm with provider [name] , who verbally acknowledged these results. Electronically Signed   By: Obie Dredge M.D.   On: 08/15/2023 14:47   I, [name], personally (independently) visualized and performed the interpretation of the images attached in this note.     Assessment and Plan: 68 y.o. female with left pelvis fracture.  This was very surprising based on her history of not having any trauma.  I did discuss the x-ray images with the radiologist who does not think it is a pathological fracture.  Plan for MRI to further characterize age and morphology of fracture.  For now we will treat with limited weightbearing using a walker and hydrocodone for pain.  Recheck in 2 weeks.   PDMP reviewed during this encounter. Orders Placed This Encounter  Procedures   [place] LIMITED JOINT SPACE STRUCTURES LOW LEFT(NO LINKED CHARGES)    Reason for Exam (SYMPTOM  OR DIAGNOSIS REQUIRED):   left hip pain    Preferred imaging location?:   [facility] HIP UNILAT W OR W/O PELVIS 2-3 VIEWS LEFT    Standing Status:   Future    Number of Occurrences:   1    Expiration Date:   09/15/2023    Reason for Exam (SYMPTOM  OR DIAGNOSIS REQUIRED):   left hip pain    Preferred imaging location?:   [facility]   MR PELVIS WO CONTRAST    Standing Status:   Future    Expiration Date:   08/14/2024    What is the patient's sedation requirement?:   No Sedation    Does the patient have a pacemaker or implanted devices?:   No    Preferred imaging location?:   [facility] (table limit-350lbs)   Meds ordered this encounter  Medications   HYDROcodone-acetaminophen (NORCO/VICODIN) 5-325 MG tablet    Sig: Take 1 tablet by mouth every 6 (six) hours as needed.    Dispense:  15 tablet    Refill:  0   AMBULATORY NON FORMULARY MEDICATION    Sig: walker Dispense 1 Dx code:  Z61.096E Use as needed    Dispense:  1 Device    Refill:  0     Discussed warning signs or symptoms. Please see discharge instructions. Patient expresses understanding.   The above documentation has been reviewed and is accurate and complete [name], M.D.

## 2023-08-15 NOTE — Patient Instructions (Addendum)
 Thank you for coming in today.   I will be getting either a CT or MRI to look closer. I am not sure about contrast yet.   I will send you a Mychart message with the plan.   Recheck in 2 weeks.   Use a walker.

## 2023-08-16 ENCOUNTER — Encounter: Payer: Self-pay | Admitting: Family Medicine

## 2023-08-16 ENCOUNTER — Ambulatory Visit: Payer: PPO | Admitting: Family Medicine

## 2023-08-16 NOTE — Progress Notes (Signed)
 Right hip x-ray shows fracture like we talked about.  Plan for MRI.

## 2023-08-17 ENCOUNTER — Encounter: Payer: Self-pay | Admitting: Family Medicine

## 2023-08-17 ENCOUNTER — Ambulatory Visit: Payer: HMO | Admitting: Family Medicine

## 2023-08-18 ENCOUNTER — Encounter: Payer: Self-pay | Admitting: Family Medicine

## 2023-08-18 ENCOUNTER — Other Ambulatory Visit (HOSPITAL_BASED_OUTPATIENT_CLINIC_OR_DEPARTMENT_OTHER): Payer: Self-pay

## 2023-08-18 ENCOUNTER — Ambulatory Visit: Payer: HMO | Admitting: Family Medicine

## 2023-08-18 VITALS — BP 144/78 | HR 89 | Temp 97.7°F | Resp 18 | Ht 64.0 in | Wt 138.8 lb

## 2023-08-18 DIAGNOSIS — E785 Hyperlipidemia, unspecified: Secondary | ICD-10-CM | POA: Diagnosis not present

## 2023-08-18 DIAGNOSIS — G4700 Insomnia, unspecified: Secondary | ICD-10-CM

## 2023-08-18 DIAGNOSIS — E559 Vitamin D deficiency, unspecified: Secondary | ICD-10-CM | POA: Diagnosis not present

## 2023-08-18 DIAGNOSIS — Z01812 Encounter for preprocedural laboratory examination: Secondary | ICD-10-CM

## 2023-08-18 DIAGNOSIS — Z01818 Encounter for other preprocedural examination: Secondary | ICD-10-CM

## 2023-08-18 DIAGNOSIS — Z01810 Encounter for preprocedural cardiovascular examination: Secondary | ICD-10-CM | POA: Diagnosis not present

## 2023-08-18 MED ORDER — TRAMADOL HCL 50 MG PO TABS
50.0000 mg | ORAL_TABLET | Freq: Three times a day (TID) | ORAL | 0 refills | Status: DC | PRN
  Filled 2023-08-18: qty 15, 5d supply, fill #0
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
## 2023-08-18 NOTE — Patient Instructions (Addendum)
 Bone density shows osteopenia, which is thinner than normal but not as bad as osteoporosis. Recommend calcium intake of 1200 to 1500 mg daily, divided into roughly 3 doses. Best source is the diet and a single dairy serving is about 500 mg, a supplement of calcium citrate once or twice daily to balance diet is fine if not getting enough in diet. Also need Vitamin D 2000 IU caps, 1 cap daily if not already taking vitamin D. Also recommend weight baring exercise on hips and upper body to keep bones strong Simple Pelvic Fracture, Adult A pelvic fracture is a break in one of the bones in the pelvis. The pelvic bones include the bones that you sit on and the bones that make up the lower part of your spine. A pelvic fracture is called "simple" if: There is only one break. The broken bone is stable and is not moving out of place. The bone does not pierce the skin. A pelvic fracture may occur along with injuries to nerves, blood vessels, soft tissues, the urinary tract, and abdominal organs. What are the causes? Common causes of this type of fracture include: A fall. A motor vehicle collision. Force or pressure that hits the pelvis. What increases the risk? You are more likely to get this injury if you: Play high-impact sports, such as football or hockey. Have thinning or weakening of your bones, such as from osteopenia or osteoporosis. Have cancer that has spread to the bone. Have a condition that is associated with falling, such as Parkinson's disease or a seizure disorder. Have had a stroke. Smoke. What are the signs or symptoms? Signs and symptoms may include: Tenderness, swelling, or bruising in the affected area. Pain when moving the hip. Pain when walking or standing. How is this diagnosed? This condition is diagnosed with a physical exam, X-ray, or CT scan. You may also have blood or urine tests: To rule out damage to other organs, such as the urethra. To check for internal bleeding in the  pelvic area. How is this treated? The goal of treatment is to get the bone to heal in its original position. Treatment includes: Using crutches, a walker, or a wheelchair until the bone heals. Medicines to treat pain. Medicines to prevent blood clots from forming in your legs. Physical therapy. Follow these instructions at home: Medicines Take over-the-counter and prescription medicines only as told by your health care provider. Ask your health care provider if the medicine prescribed to you requires you to avoid driving or using machinery. Managing pain, stiffness, and swelling  If directed, put ice on the injured area. To do this: Put ice in a plastic bag. Place a towel between your skin and the bag. Leave the ice on for 20 minutes, 2-3 times a day. Remove the ice if your skin turns bright red. This is very important. If you cannot feel pain, heat, or cold, you have a greater risk of damage to the area. Move your toes, ankles, knees, and hips often to reduce stiffness and swelling. Activity Follow your health care provider's instructions about putting weight on your legs. Avoid strenuous activities for as long as directed by your health care provider. Return to your normal activities as told by your health care provider. Ask your health care provider what activities are safe for you. Use items to help you with your activities, such as: A long-handled shoehorn to help you put your shoes on. Elastic shoelaces that do not need to be retied. A reacher  or grabber to pick items up off the floor or from a shelf. General instructions  Do not  drive or use machinery until your health care provider tells you it is safe. Use a wheelchair or other assistive devices as directed by your health care provider. When you are ready to walk, start by using crutches or a walker to help support your body weight. Have someone help you at home as you recover. Wear compression stockings as told by your  health care provider. Do not use any products that contain nicotine or tobacco. These products include cigarettes, chewing tobacco, and vaping devices, such as e-cigarettes. If you need help quitting, ask your health care provider. If you have an underlying condition that caused your pelvic fracture, work with your health care provider to manage your condition. Keep all follow-up visits. This is important. Contact a health care provider if: Your pain gets worse. Your pain is not relieved with medicines. You have difficulty or increased pain with walking. Get help right away if: You feel light-headed or faint. You develop chest pain. You develop shortness of breath. You have a fever. You have blood in your urine or your stools. You have bleeding in your vagina. You have difficulty or pain with urination or with passing stool. You have new or increased swelling in one of your legs. You have numbness in your legs or groin area. These symptoms may be an emergency. Get help right away. Call 911. Do not wait to see if the symptoms will go away. Do not drive yourself to the hospital. Summary A pelvic fracture is a break in one of the bones in the pelvis. These are the bones that you sit on and the bones that make up the lower part of your spine. A pelvic fracture is called "simple" if there is only one break, the broken bone is stable and not moving out of place, and the bone does not pierce the skin. Common causes of this type of fracture include a fall, a motor vehicle collision, or a force or pressure that hits the pelvis. The goal of treatment is to get the bone to heal in its original position. Treatment includes limiting the weight that you put on your affected leg. You may have to use a wheelchair, crutches, or a walker until your bone heals. Other treatments include physical therapy and medicines to treat pain and prevent blood clots. This information is not intended to replace advice  given to you by your health care provider. Make sure you discuss any questions you have with your health care provider. Document Revised: 03/17/2021 Document Reviewed: 03/17/2021 Elsevier Patient Education  2024 [employer].

## 2023-08-20 ENCOUNTER — Ambulatory Visit

## 2023-08-20 DIAGNOSIS — S3210XA Unspecified fracture of sacrum, initial encounter for closed fracture: Secondary | ICD-10-CM | POA: Diagnosis not present

## 2023-08-20 DIAGNOSIS — M25552 Pain in left hip: Secondary | ICD-10-CM

## 2023-08-20 DIAGNOSIS — S32512A Fracture of superior rim of left pubis, initial encounter for closed fracture: Secondary | ICD-10-CM | POA: Diagnosis not present

## 2023-08-20 DIAGNOSIS — S7011XA Contusion of right thigh, initial encounter: Secondary | ICD-10-CM | POA: Diagnosis not present

## 2023-08-20 DIAGNOSIS — M7601 Gluteal tendinitis, right hip: Secondary | ICD-10-CM | POA: Diagnosis not present

## 2023-08-20 DIAGNOSIS — S3219XA Other fracture of sacrum, initial encounter for closed fracture: Secondary | ICD-10-CM

## 2023-08-20 DIAGNOSIS — S76012A Strain of muscle, fascia and tendon of left hip, initial encounter: Secondary | ICD-10-CM | POA: Diagnosis not present

## 2023-08-20 DIAGNOSIS — S76011A Strain of muscle, fascia and tendon of right hip, initial encounter: Secondary | ICD-10-CM | POA: Diagnosis not present

## 2023-08-22 ENCOUNTER — Encounter: Payer: Self-pay | Admitting: Family Medicine

## 2023-08-23 NOTE — Telephone Encounter (Signed)
 Forwarding to Dr. Denyse Amass to review and advise.

## 2023-08-25 ENCOUNTER — Encounter: Payer: Self-pay | Admitting: Family Medicine

## 2023-08-25 ENCOUNTER — Ambulatory Visit: Admitting: Family Medicine

## 2023-08-25 ENCOUNTER — Telehealth: Payer: Self-pay

## 2023-08-25 ENCOUNTER — Ambulatory Visit: Payer: HMO | Admitting: Family Medicine

## 2023-08-25 VITALS — BP 150/76 | HR 93 | Ht 64.0 in

## 2023-08-25 DIAGNOSIS — M8440XA Pathological fracture, unspecified site, initial encounter for fracture: Secondary | ICD-10-CM

## 2023-08-25 DIAGNOSIS — R3915 Urgency of urination: Secondary | ICD-10-CM | POA: Diagnosis not present

## 2023-08-25 DIAGNOSIS — M8000XD Age-related osteoporosis with current pathological fracture, unspecified site, subsequent encounter for fracture with routine healing: Secondary | ICD-10-CM

## 2023-08-25 DIAGNOSIS — S32512D Fracture of superior rim of left pubis, subsequent encounter for fracture with routine healing: Secondary | ICD-10-CM

## 2023-08-25 DIAGNOSIS — R8290 Unspecified abnormal findings in urine: Secondary | ICD-10-CM

## 2023-08-25 LAB — URINALYSIS, ROUTINE W REFLEX MICROSCOPIC
Bilirubin Urine: NEGATIVE
Hgb urine dipstick: NEGATIVE
Ketones, ur: NEGATIVE
Leukocytes,Ua: NEGATIVE
Nitrite: NEGATIVE
RBC / HPF: NONE SEEN (ref 0–?)
Specific Gravity, Urine: 1.01 (ref 1.000–1.030)
Total Protein, Urine: NEGATIVE
Urine Glucose: NEGATIVE
Urobilinogen, UA: 0.2 (ref 0.0–1.0)
WBC, UA: NONE SEEN (ref 0–?)
pH: 6 (ref 5.0–8.0)

## 2023-08-25 NOTE — Progress Notes (Signed)
 Urinalysis does not indicate an infection.  Happy birthday tomorrow by the way sorry I did not say so in clinic.

## 2023-08-25 NOTE — Progress Notes (Signed)
 I, Stevenson Clinch, CMA acting as a scribe for [name], MD.  Shannon [name] is a 68 y.o. female who presents to [facility] today for f/u pelvic fx. Pt was last seen by Dr. Denyse Amass on 08/15/23 and after XR revealed fx, a MRI was ordered.  Today, pt reports continued cracking in the hips and pelvis. This is not painful, but concerning. Sx causing night disturbance. Also of note, bladder feels heavy and achy, urine has been more cloudy. Denies dysuria, increased urgency/frequency, fever. Has had daily morning chills.   Dx testing: 08/20/23 Pelvis MRI  08/15/23 L hip XR  06/02/23 DEXA scan  Pertinent review of systems: Positive for urinary frequency and urgency and some mild fevers and chills.  Relevant historical information: Osteopenia based on DEXA scan. Physically active and fit  Exam:  BP (!) 150/76   Pulse 93   Ht 5\' 4"  (1.626 m)   SpO2 99%   BMI 23.82 kg/m  General: Well Developed, well nourished, and in no acute distress.   MSK: Left hip decreased range of motion tender palpation.    Lab and Radiology Results  EXAM: MRI PELVIS WITHOUT CONTRAST   TECHNIQUE: Multiplanar multisequence MR imaging of the pelvis was performed. No intravenous contrast was administered.   COMPARISON:  X-ray 08/15/2023   FINDINGS: Bones/Joint/Cartilage   Acute to subacute fracture of the parasymphyseal aspect of the left pubic bone with mild fracture displacement. Small amount of fluid at the fracture cleft (series 6, image 27). Bone marrow edema extends into the superior greater than inferior pubic rami on the left. Pubic symphysis intact without diastasis. Acute to subacute nondisplaced fracture of the right sacral ala with intense bone marrow edema (series 6, image 16). There is some subtle bone marrow edema in the left sacrum at S1 without a well-defined fracture line, favored to represent stress related changes or developing insufficiency  fracture. SI joints intact without diastasis. Hip joints are intact without fracture or dislocation. No femoral head avascular necrosis. Lumbar levocurvature with degenerative spondylosis, not well assessed. No marrow replacing bone lesion.   Ligaments   Intact.   Muscles and Tendons   Intramuscular edema within the left adductor musculature proximally. Complex intramuscular fluid collection within the proximal adductor magnus muscle measuring 5.6 x 1.8 x 2.5 cm, compatible with an intramuscular hematoma. Right-sided gluteus medius and minimus tendinosis with partial-thickness insertional tearing of both tendons. Bilateral hamstring tendinosis with partial-thickness tearing, more pronounced on the left. Small volume right iliopsoas bursal fluid.   Soft tissues   Soft tissue swelling anterior to the pubic symphysis. No organized hematoma. No inguinal lymphadenopathy. No acute abnormality is evident within the pelvis.   IMPRESSION: 1. Acute-to-subacute fracture of the parasymphyseal aspect of the left pubic bone with mild fracture displacement. 2. Acute-to-subacute nondisplaced fracture of the right sacral ala. 3. Subtle bone marrow edema in the left sacrum at S1 without a well-defined fracture line, favored to represent stress-related changes or developing insufficiency fracture. 4. Intramuscular hematoma within the proximal left adductor magnus muscle measuring 5.6 x 1.8 x 2.5 cm. 5. Right-sided gluteus medius and minimus tendinosis with partial-thickness insertional tearing of both tendons. 6. Bilateral hamstring tendinosis with partial-thickness tearing, more pronounced on the left. 7. Small volume right iliopsoas bursal fluid.     Electronically Signed   By: Duanne Guess D.O.   On: 08/25/2023 08:50 I, [name], personally (independently) visualized and performed the interpretation of the images attached in this  note.      Assessment and Plan: 68 y.o.  female with left pubic fracture thought to be due to insufficiency or stress reaction.  She also has insufficiency fractures of the sacrum.  Additionally she has a large hematoma in the left adductor magnus. Plan to continue conservative management with limited weightbearing.  Recheck in 1 month.  Urinary frequency and urgency.  Will check urinalysis and culture today.  Osteoporosis fracture.  Patient has osteopenia on DEXA scan and unexplained insufficiency/fragility fractures.  Functionally she has osteoporosis this not well-controlled.  Vitamin D has been optimized already.  Will check parathyroid hormone and calcium and work on authorization for [employer] or [employer].  Anticipate starting this when she feels better from the fracture.  Recheck 1 month.   PDMP not reviewed this encounter. Orders Placed This Encounter  Procedures   Urine Culture    Standing Status:   Future    Number of Occurrences:   1    Expiration Date:   08/24/2024   Urinalysis, Routine w reflex microscopic    Standing Status:   Future    Number of Occurrences:   1    Expiration Date:   08/24/2024   PTH, intact and calcium    Standing Status:   Future    Number of Occurrences:   1    Expiration Date:   08/24/2024   No orders of the defined types were placed in this encounter.    Discussed warning signs or symptoms. Please see discharge instructions. Patient expresses understanding.   The above documentation has been reviewed and is accurate and complete [name], M.D.

## 2023-08-25 NOTE — Patient Instructions (Signed)
 Thank you for coming in today.   Please get labs and urine culture today before you leave   OK to cancel Monday's appointment with me  Check back in 1 month

## 2023-08-25 NOTE — Telephone Encounter (Signed)
-----   Message from [name] sent at 08/25/2023  9:21 AM EDT ----- Regarding: Forteo and Tymlos Please try to auth either tymlos or forteo

## 2023-08-26 NOTE — Telephone Encounter (Signed)
 Prior Authorization initiated for [facility] via [email or web address] KEY: [id number]

## 2023-08-27 LAB — URINE CULTURE
MICRO NUMBER:: 16197357
Result:: NO GROWTH
SPECIMEN QUALITY:: ADEQUATE

## 2023-08-27 LAB — PTH, INTACT AND CALCIUM
Calcium: 9.2 mg/dL (ref 8.6–10.4)
PTH: 21 pg/mL (ref 16–77)

## 2023-08-28 ENCOUNTER — Encounter: Payer: Self-pay | Admitting: Family Medicine

## 2023-08-29 ENCOUNTER — Ambulatory Visit: Admitting: Family Medicine

## 2023-08-29 NOTE — Telephone Encounter (Signed)
 Prior Authorization for TYMLOS DENIED      The reason we denied your request is because the following prior authorization criteria were not met: Bone mineral density (BMD) T-score of -2.5 or lower in the lumbar spine, femoral neck, total hip, or radius (one-third radius site).

## 2023-08-29 NOTE — Telephone Encounter (Signed)
 Prior Authorization initiated for FORTEO via [email or web address] KEY: [id number]

## 2023-08-31 ENCOUNTER — Other Ambulatory Visit: Payer: Self-pay

## 2023-08-31 ENCOUNTER — Encounter (HOSPITAL_COMMUNITY): Payer: Self-pay

## 2023-08-31 ENCOUNTER — Other Ambulatory Visit (HOSPITAL_BASED_OUTPATIENT_CLINIC_OR_DEPARTMENT_OTHER): Payer: Self-pay

## 2023-08-31 ENCOUNTER — Other Ambulatory Visit (HOSPITAL_COMMUNITY): Payer: Self-pay

## 2023-08-31 DIAGNOSIS — S3289XA Fracture of other parts of pelvis, initial encounter for closed fracture: Secondary | ICD-10-CM | POA: Diagnosis not present

## 2023-08-31 MED ORDER — TERIPARATIDE 600 MCG/2.4ML ~~LOC~~ SOPN
20.0000 ug | PEN_INJECTOR | Freq: Every day | SUBCUTANEOUS | 11 refills | Status: DC
  Filled 2023-08-31: qty 2.4, 28d supply, fill #0
  Filled 2023-08-31: qty 2.4, fill #0

## 2023-08-31 NOTE — Telephone Encounter (Signed)
 Clarification fax received from [facility].   Form completed and faxed back.

## 2023-08-31 NOTE — Addendum Note (Signed)
 Addended by: Dierdre Searles on: 08/31/2023 02:25 PM   Modules accepted: Orders

## 2023-08-31 NOTE — Telephone Encounter (Signed)
 Pharmacy Benefit  Prior Authorization for [facility] APPROVED PA# [id number] Valid: 08/31/23-08/30/25        RX sent to [facility].

## 2023-09-01 ENCOUNTER — Other Ambulatory Visit: Payer: Self-pay | Admitting: Family Medicine

## 2023-09-01 ENCOUNTER — Other Ambulatory Visit (HOSPITAL_BASED_OUTPATIENT_CLINIC_OR_DEPARTMENT_OTHER): Payer: Self-pay

## 2023-09-01 ENCOUNTER — Other Ambulatory Visit: Payer: Self-pay

## 2023-09-01 MED ORDER — TEMAZEPAM 15 MG PO CAPS
15.0000 mg | ORAL_CAPSULE | Freq: Every evening | ORAL | 1 refills | Status: DC | PRN
  Filled 2023-09-01: qty 30, 30d supply, fill #0
  Filled 2023-11-04: qty 30, 30d supply, fill #1

## 2023-09-01 NOTE — Telephone Encounter (Signed)
 Requesting: Temazepam 15 mg Contract: N/A UDS: N/A Last Visit: 08/18/2023 Next Visit: 11/03/2023 Last Refill: 04/17/2024  Please Advise

## 2023-09-02 ENCOUNTER — Other Ambulatory Visit (HOSPITAL_COMMUNITY): Payer: Self-pay

## 2023-09-05 ENCOUNTER — Encounter: Payer: Self-pay | Admitting: Family Medicine

## 2023-09-06 ENCOUNTER — Other Ambulatory Visit: Payer: Self-pay

## 2023-09-06 ENCOUNTER — Encounter: Payer: Self-pay | Admitting: Obstetrics & Gynecology

## 2023-09-06 ENCOUNTER — Ambulatory Visit: Admitting: Family Medicine

## 2023-09-06 NOTE — Progress Notes (Signed)
 Patient would like to talk to PMD before filling Forteo due to price.  Brandy, CMA from office will let me know if she decides to fill. Dis-enrolling for now.

## 2023-09-06 NOTE — Progress Notes (Signed)
 Pharmacy Patient Advocate Encounter  Insurance verification completed.   The patient is insured through [facility] ADVANTAGE/RX ADVANCE   Ran test claim for Teriparatide (Forteo). Co-pay is $1,[phone].   This test claim was processed through [facility]- copay amounts may vary at other pharmacies due to pharmacy/plan contracts, or as the patient moves through the different stages of their insurance plan.    Patient is eligible for Medicare Payment Plan.
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
## 2023-09-08 ENCOUNTER — Ambulatory Visit: Admitting: Family Medicine

## 2023-09-08 ENCOUNTER — Other Ambulatory Visit: Payer: Self-pay

## 2023-09-08 ENCOUNTER — Other Ambulatory Visit (HOSPITAL_BASED_OUTPATIENT_CLINIC_OR_DEPARTMENT_OTHER): Payer: Self-pay

## 2023-09-08 ENCOUNTER — Encounter: Payer: Self-pay | Admitting: Family Medicine

## 2023-09-08 VITALS — BP 142/92 | HR 89 | Ht 64.0 in

## 2023-09-08 DIAGNOSIS — H43812 Vitreous degeneration, left eye: Secondary | ICD-10-CM | POA: Diagnosis not present

## 2023-09-08 DIAGNOSIS — G8929 Other chronic pain: Secondary | ICD-10-CM

## 2023-09-08 DIAGNOSIS — M84454A Pathological fracture, pelvis, initial encounter for fracture: Secondary | ICD-10-CM | POA: Insufficient documentation

## 2023-09-08 DIAGNOSIS — S3210XA Unspecified fracture of sacrum, initial encounter for closed fracture: Secondary | ICD-10-CM | POA: Diagnosis not present

## 2023-09-08 DIAGNOSIS — M25512 Pain in left shoulder: Secondary | ICD-10-CM | POA: Diagnosis not present

## 2023-09-08 DIAGNOSIS — M12812 Other specific arthropathies, not elsewhere classified, left shoulder: Secondary | ICD-10-CM

## 2023-09-08 DIAGNOSIS — M84454D Pathological fracture, pelvis, subsequent encounter for fracture with routine healing: Secondary | ICD-10-CM

## 2023-09-08 DIAGNOSIS — M19012 Primary osteoarthritis, left shoulder: Secondary | ICD-10-CM

## 2023-09-08 MED ORDER — VITAMIN D (ERGOCALCIFEROL) 1.25 MG (50000 UNIT) PO CAPS
50000.0000 [IU] | ORAL_CAPSULE | ORAL | 0 refills | Status: DC
  Filled 2023-09-08: qty 12, 84d supply, fill #0

## 2023-09-08 NOTE — Assessment & Plan Note (Signed)
 Patient did have the insufficiency fractures that appears to the right sacral alla as well as the left sacral area.  Has had significant amount of swelling as well.  Patient's bone density was relatively good insufficiency fracture patient is considered osteoporotic.  We discussed the potential for Reclast.  Potentially repeating the bone density in 1 year instead.  Patient would like to consider this.  Continue the pain medications that she is doing at the moment inspected which is not very many.  Increasing activity slowly otherwise.  Will start formal physical therapy here for strength and conditioning in the near future as well.

## 2023-09-08 NOTE — Assessment & Plan Note (Signed)
 Patient given injection today for chronic problem with worsening symptoms.  Discussed icing regimen and home exercises.  Discussed home exercises and icing regimen otherwise.  Increase activity slowly.  Follow-up again in 6 to 8 weeks otherwise.

## 2023-09-08 NOTE — Assessment & Plan Note (Signed)
 Repeat injection given again today, tolerated the procedure well, discussed icing regimen and home exercises, increase activity slowly.  Discussed icing regimen and home exercises.  Increase activity slowly.  Follow-up with me again in 6 6 to 8 weeks can repeat injections every 10 to 12 weeks if needed.

## 2023-09-08 NOTE — Patient Instructions (Addendum)
 Once weekly Vit D called in Injections in shoulder today Keep doing daily w K2 Read about reclast PT Kathryne Sharper  See me in 6-8 weeks
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
## 2023-09-15 ENCOUNTER — Other Ambulatory Visit: Payer: Self-pay

## 2023-09-15 ENCOUNTER — Ambulatory Visit: Admitting: Family Medicine

## 2023-09-15 ENCOUNTER — Ambulatory Visit: Attending: Family Medicine

## 2023-09-15 DIAGNOSIS — R2689 Other abnormalities of gait and mobility: Secondary | ICD-10-CM | POA: Diagnosis not present

## 2023-09-15 DIAGNOSIS — R29898 Other symptoms and signs involving the musculoskeletal system: Secondary | ICD-10-CM | POA: Insufficient documentation

## 2023-09-15 DIAGNOSIS — S3210XA Unspecified fracture of sacrum, initial encounter for closed fracture: Secondary | ICD-10-CM | POA: Diagnosis not present

## 2023-09-15 DIAGNOSIS — M6281 Muscle weakness (generalized): Secondary | ICD-10-CM | POA: Insufficient documentation

## 2023-09-15 NOTE — Patient Instructions (Signed)
 Aquatic Therapy: What to Expect!  Where:  [facility] at [facility] [address] [place], [place] [address] [phone]           How to Prepare:  If you require assistance with dressing, with transportation (ie: wheel chair), or toileting, a caregiver must attend the entire session with you (unless your primary therapists feels this is not necessary).   If there is thunder during your appointment, you will be asked to leave pool area. You have the option to finish your session in the physical therapy area near the gym. Masks in the pool area are optional. Your face will remain dry during your session, so you are welcome to keep your mask on, if desired. You will be spaced at least 6 feet from other aquatic patients.  Please bring your own swim towel to dry off with.   There are Men's and Women's locker rooms with showers, as well as gender neutral bathrooms in the pool area.  Please arrive IN YOUR SUIT and a few minutes prior to your appointment - this helps to avoid delays in starting your session. Head to the pool and await your appointment on the bench on the pool deck. Please make sure to attend to any toileting needs prior to entering the pool. Once on the pool deck your therapist will ask you to sign the Patient  Consent and Assignment of Benefits form. Your therapist may take your blood pressure prior to, during and after your session if indicated. We usually try and create a home exercise program based on activities we do in the pool. Some patients do not want to or do not have the ability to participate in an aquatic home program - this is not a barrier in any way to you participating in aquatic therapy as part of your current therapy plan!  Appointments:  All sessions are 45 minutes  About the pool: Entering the pool: Your therapist will assist you if needed; there are two ways to enter the pool - stairs or a mechanical lift. Your therapist will determine  the most appropriate way for you. Water temperature is usually around 91-95.  There is a lap pool with a temperature around 84 There may be other therapists and patients in the pool at the same time.   Contact Info:            To cancel appointment, please call [facility] at [phone] If you are running late, please call [facility] at [phone]

## 2023-09-19 ENCOUNTER — Ambulatory Visit

## 2023-09-19 DIAGNOSIS — R2689 Other abnormalities of gait and mobility: Secondary | ICD-10-CM

## 2023-09-19 DIAGNOSIS — M6281 Muscle weakness (generalized): Secondary | ICD-10-CM

## 2023-09-19 DIAGNOSIS — R29898 Other symptoms and signs involving the musculoskeletal system: Secondary | ICD-10-CM
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
## 2023-09-22 ENCOUNTER — Ambulatory Visit

## 2023-09-22 DIAGNOSIS — R2689 Other abnormalities of gait and mobility: Secondary | ICD-10-CM

## 2023-09-22 DIAGNOSIS — M6281 Muscle weakness (generalized): Secondary | ICD-10-CM

## 2023-09-22 DIAGNOSIS — R29898 Other symptoms and signs involving the musculoskeletal system: Secondary | ICD-10-CM
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
## 2023-09-23 ENCOUNTER — Ambulatory Visit: Admitting: Family Medicine

## 2023-09-26 ENCOUNTER — Ambulatory Visit

## 2023-09-26 DIAGNOSIS — R29898 Other symptoms and signs involving the musculoskeletal system: Secondary | ICD-10-CM

## 2023-09-26 DIAGNOSIS — M6281 Muscle weakness (generalized): Secondary | ICD-10-CM

## 2023-09-26 DIAGNOSIS — R2689 Other abnormalities of gait and mobility: Secondary | ICD-10-CM
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
## 2023-09-27 ENCOUNTER — Other Ambulatory Visit: Payer: Self-pay | Admitting: Medical Genetics

## 2023-09-27 ENCOUNTER — Telehealth: Payer: Self-pay

## 2023-09-27 NOTE — Telephone Encounter (Signed)
 Spoke with patient after discussing new complaint of pain with referring provider. Advised patient that she should return to using RW and limit total walking <1/2 mile for the next 2 weeks. Patient verbalized understanding.   Shanie Mauzy, PT, DPT, ATC 09/27/23 1:10 PM

## 2023-09-29 ENCOUNTER — Ambulatory Visit

## 2023-09-29 DIAGNOSIS — R2689 Other abnormalities of gait and mobility: Secondary | ICD-10-CM

## 2023-09-29 DIAGNOSIS — M6281 Muscle weakness (generalized): Secondary | ICD-10-CM

## 2023-09-29 DIAGNOSIS — R29898 Other symptoms and signs involving the musculoskeletal system: Secondary | ICD-10-CM
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
## 2023-09-29 NOTE — Patient Instructions (Signed)
 Aquatic Therapy: What to Expect!  Where:  [facility] at [facility] [address] [place], [place] [address] [phone]           How to Prepare:  If you require assistance with dressing, with transportation (ie: wheel chair), or toileting, a caregiver must attend the entire session with you (unless your primary therapists feels this is not necessary).   If there is thunder during your appointment, you will be asked to leave pool area. You have the option to finish your session in the physical therapy area near the gym. Masks in the pool area are optional. Your face will remain dry during your session, so you are welcome to keep your mask on, if desired. You will be spaced at least 6 feet from other aquatic patients.  Please bring your own swim towel to dry off with.   There are Men's and Women's locker rooms with showers, as well as gender neutral bathrooms in the pool area.  Please arrive IN YOUR SUIT and a few minutes prior to your appointment - this helps to avoid delays in starting your session. Head to the pool and await your appointment on the bench on the pool deck. Please make sure to attend to any toileting needs prior to entering the pool. Once on the pool deck your therapist will ask you to sign the Patient  Consent and Assignment of Benefits form. Your therapist may take your blood pressure prior to, during and after your session if indicated. We usually try and create a home exercise program based on activities we do in the pool. Some patients do not want to or do not have the ability to participate in an aquatic home program - this is not a barrier in any way to you participating in aquatic therapy as part of your current therapy plan!  Appointments:  All sessions are 45 minutes  About the pool: Entering the pool: Your therapist will assist you if needed; there are two ways to enter the pool - stairs or a mechanical lift. Your therapist will determine  the most appropriate way for you. Water temperature is usually around 91-95.  There is a lap pool with a temperature around 84 There may be other therapists and patients in the pool at the same time.   Contact Info:            To cancel appointment, please call [facility] at [phone] If you are running late, please call [facility] at [phone]

## 2023-10-03 ENCOUNTER — Ambulatory Visit

## 2023-10-03 DIAGNOSIS — M6281 Muscle weakness (generalized): Secondary | ICD-10-CM

## 2023-10-03 DIAGNOSIS — R29898 Other symptoms and signs involving the musculoskeletal system: Secondary | ICD-10-CM

## 2023-10-03 DIAGNOSIS — R2689 Other abnormalities of gait and mobility: Secondary | ICD-10-CM
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
## 2023-10-06 ENCOUNTER — Ambulatory Visit

## 2023-10-06 ENCOUNTER — Encounter: Payer: Self-pay | Admitting: Family Medicine

## 2023-10-06 DIAGNOSIS — M6281 Muscle weakness (generalized): Secondary | ICD-10-CM | POA: Diagnosis not present

## 2023-10-06 DIAGNOSIS — R29898 Other symptoms and signs involving the musculoskeletal system: Secondary | ICD-10-CM

## 2023-10-06 DIAGNOSIS — R2689 Other abnormalities of gait and mobility: Secondary | ICD-10-CM
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
## 2023-10-07 NOTE — Telephone Encounter (Signed)
 INR and aPTT orders discontinued.

## 2023-10-10 ENCOUNTER — Ambulatory Visit

## 2023-10-10 DIAGNOSIS — R2689 Other abnormalities of gait and mobility: Secondary | ICD-10-CM

## 2023-10-10 DIAGNOSIS — M6281 Muscle weakness (generalized): Secondary | ICD-10-CM

## 2023-10-10 DIAGNOSIS — R29898 Other symptoms and signs involving the musculoskeletal system: Secondary | ICD-10-CM
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
## 2023-10-11 ENCOUNTER — Other Ambulatory Visit

## 2023-10-11 ENCOUNTER — Ambulatory Visit: Admitting: Family Medicine

## 2023-10-11 ENCOUNTER — Other Ambulatory Visit: Payer: Self-pay

## 2023-10-11 ENCOUNTER — Ambulatory Visit: Payer: Self-pay

## 2023-10-11 ENCOUNTER — Other Ambulatory Visit (HOSPITAL_BASED_OUTPATIENT_CLINIC_OR_DEPARTMENT_OTHER): Payer: Self-pay

## 2023-10-11 ENCOUNTER — Telehealth: Payer: Self-pay | Admitting: Family Medicine

## 2023-10-11 ENCOUNTER — Encounter: Payer: Self-pay | Admitting: Family Medicine

## 2023-10-11 VITALS — BP 124/82 | HR 74 | Ht 64.0 in

## 2023-10-11 DIAGNOSIS — Z006 Encounter for examination for normal comparison and control in clinical research program: Secondary | ICD-10-CM

## 2023-10-11 DIAGNOSIS — M25512 Pain in left shoulder: Secondary | ICD-10-CM | POA: Diagnosis not present

## 2023-10-11 DIAGNOSIS — M84454D Pathological fracture, pelvis, subsequent encounter for fracture with routine healing: Secondary | ICD-10-CM | POA: Diagnosis not present

## 2023-10-11 MED ORDER — CALCITONIN (SALMON) 200 UNIT/ACT NA SOLN
1.0000 | Freq: Every day | NASAL | 12 refills | Status: DC
  Filled 2023-10-11: qty 3.7, 30d supply, fill #0
  Filled 2024-05-22: qty 3.7, 30d supply, fill #1

## 2023-10-11 MED ORDER — MELOXICAM 15 MG PO TABS
15.0000 mg | ORAL_TABLET | Freq: Every day | ORAL | 0 refills | Status: DC
  Filled 2023-10-11: qty 30, 30d supply, fill #0

## 2023-10-11 MED ORDER — GABAPENTIN 100 MG PO CAPS
200.0000 mg | ORAL_CAPSULE | Freq: Every day | ORAL | 0 refills | Status: DC
  Filled 2023-10-11: qty 180, 90d supply, fill #0

## 2023-10-11 NOTE — Patient Instructions (Addendum)
 Calcitonin 1 spray daily for 1 month Okay to switch to the cane See you again in 1 month

## 2023-10-11 NOTE — Telephone Encounter (Signed)
Rx filled for patient.  

## 2023-10-11 NOTE — Telephone Encounter (Signed)
 Patient called and asked if Dr. Felipe Horton can send in a prescription for Meloxicam  and a refill for gabapentin . Please advise.

## 2023-10-11 NOTE — Assessment & Plan Note (Signed)
 Patient did have a little setback but is now 80% better again.  Discussed with patient about what to do next.  I do think that aquatic therapy and slowly increasing again would be beneficial.  Calcitonin given and hopefully this will help with increasing healing aspect.  Discussed with patient about calcium levels and potentially supplementation.  Patient will follow-up with me again in 4 weeks otherwise.
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
## 2023-10-13 ENCOUNTER — Ambulatory Visit: Attending: Family Medicine

## 2023-10-13 DIAGNOSIS — R2689 Other abnormalities of gait and mobility: Secondary | ICD-10-CM | POA: Insufficient documentation

## 2023-10-13 DIAGNOSIS — M6281 Muscle weakness (generalized): Secondary | ICD-10-CM | POA: Diagnosis not present

## 2023-10-13 DIAGNOSIS — R29898 Other symptoms and signs involving the musculoskeletal system: Secondary | ICD-10-CM | POA: Diagnosis not present

## 2023-10-13 NOTE — Patient Instructions (Signed)
 Aquatic Therapy: What to Expect!  Where:  [facility] at [facility] [address] [place], [place] [address] [phone]           How to Prepare:  If you require assistance with dressing, with transportation (ie: wheel chair), or toileting, a caregiver must attend the entire session with you (unless your primary therapists feels this is not necessary).   If there is thunder during your appointment, you will be asked to leave pool area. You have the option to finish your session in the physical therapy area near the gym. Masks in the pool area are optional. Your face will remain dry during your session, so you are welcome to keep your mask on, if desired. You will be spaced at least 6 feet from other aquatic patients.  Please bring your own swim towel to dry off with.   There are Men's and Women's locker rooms with showers, as well as gender neutral bathrooms in the pool area.  Please arrive IN YOUR SUIT and a few minutes prior to your appointment - this helps to avoid delays in starting your session. Head to the pool and await your appointment on the bench on the pool deck. Please make sure to attend to any toileting needs prior to entering the pool. Once on the pool deck your therapist will ask you to sign the Patient  Consent and Assignment of Benefits form. Your therapist may take your blood pressure prior to, during and after your session if indicated. We usually try and create a home exercise program based on activities we do in the pool. Some patients do not want to or do not have the ability to participate in an aquatic home program - this is not a barrier in any way to you participating in aquatic therapy as part of your current therapy plan!  Appointments:  All sessions are 45 minutes  About the pool: Entering the pool: Your therapist will assist you if needed; there are two ways to enter the pool - stairs or a mechanical lift. Your therapist will determine  the most appropriate way for you. Water temperature is usually around 91-95.  There is a lap pool with a temperature around 84 There may be other therapists and patients in the pool at the same time.   Contact Info:            To cancel appointment, please call [facility] at [phone] If you are running late, please call [facility] at [phone]
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
## 2023-10-17 ENCOUNTER — Ambulatory Visit (HOSPITAL_BASED_OUTPATIENT_CLINIC_OR_DEPARTMENT_OTHER): Attending: Family Medicine | Admitting: Physical Therapy

## 2023-10-17 ENCOUNTER — Encounter (HOSPITAL_BASED_OUTPATIENT_CLINIC_OR_DEPARTMENT_OTHER): Payer: Self-pay | Admitting: Physical Therapy

## 2023-10-17 ENCOUNTER — Ambulatory Visit: Payer: PPO | Admitting: Family Medicine

## 2023-10-17 DIAGNOSIS — R2689 Other abnormalities of gait and mobility: Secondary | ICD-10-CM | POA: Insufficient documentation

## 2023-10-17 DIAGNOSIS — M6281 Muscle weakness (generalized): Secondary | ICD-10-CM | POA: Diagnosis not present

## 2023-10-17 DIAGNOSIS — R29898 Other symptoms and signs involving the musculoskeletal system: Secondary | ICD-10-CM | POA: Diagnosis not present
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
## 2023-10-18 ENCOUNTER — Ambulatory Visit: Payer: PPO | Admitting: Family Medicine

## 2023-10-18 LAB — GENECONNECT MOLECULAR SCREEN: Genetic Analysis Overall Interpretation: NEGATIVE

## 2023-10-20 ENCOUNTER — Ambulatory Visit: Admitting: Family Medicine

## 2023-10-21 ENCOUNTER — Encounter (HOSPITAL_BASED_OUTPATIENT_CLINIC_OR_DEPARTMENT_OTHER): Payer: Self-pay | Admitting: Physical Therapy

## 2023-10-21 ENCOUNTER — Ambulatory Visit (HOSPITAL_BASED_OUTPATIENT_CLINIC_OR_DEPARTMENT_OTHER): Admitting: Physical Therapy

## 2023-10-21 DIAGNOSIS — M6281 Muscle weakness (generalized): Secondary | ICD-10-CM

## 2023-10-21 DIAGNOSIS — R2689 Other abnormalities of gait and mobility: Secondary | ICD-10-CM

## 2023-10-21 DIAGNOSIS — R29898 Other symptoms and signs involving the musculoskeletal system: Secondary | ICD-10-CM
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
## 2023-10-24 ENCOUNTER — Encounter (HOSPITAL_BASED_OUTPATIENT_CLINIC_OR_DEPARTMENT_OTHER): Payer: Self-pay | Admitting: Physical Therapy

## 2023-10-24 ENCOUNTER — Ambulatory Visit (HOSPITAL_BASED_OUTPATIENT_CLINIC_OR_DEPARTMENT_OTHER): Admitting: Physical Therapy

## 2023-10-24 DIAGNOSIS — R29898 Other symptoms and signs involving the musculoskeletal system: Secondary | ICD-10-CM

## 2023-10-24 DIAGNOSIS — M6281 Muscle weakness (generalized): Secondary | ICD-10-CM | POA: Diagnosis not present

## 2023-10-24 DIAGNOSIS — R2689 Other abnormalities of gait and mobility: Secondary | ICD-10-CM
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
## 2023-10-25 ENCOUNTER — Ambulatory Visit

## 2023-10-26 ENCOUNTER — Ambulatory Visit (HOSPITAL_BASED_OUTPATIENT_CLINIC_OR_DEPARTMENT_OTHER): Payer: Self-pay | Admitting: Physical Therapy

## 2023-10-26 ENCOUNTER — Encounter (HOSPITAL_BASED_OUTPATIENT_CLINIC_OR_DEPARTMENT_OTHER): Payer: Self-pay | Admitting: Physical Therapy

## 2023-10-26 ENCOUNTER — Other Ambulatory Visit: Payer: Self-pay

## 2023-10-26 DIAGNOSIS — R2689 Other abnormalities of gait and mobility: Secondary | ICD-10-CM

## 2023-10-26 DIAGNOSIS — R29898 Other symptoms and signs involving the musculoskeletal system: Secondary | ICD-10-CM

## 2023-10-26 DIAGNOSIS — M6281 Muscle weakness (generalized): Secondary | ICD-10-CM | POA: Diagnosis not present
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
## 2023-11-01 ENCOUNTER — Encounter (HOSPITAL_BASED_OUTPATIENT_CLINIC_OR_DEPARTMENT_OTHER): Payer: Self-pay | Admitting: Physical Therapy

## 2023-11-01 ENCOUNTER — Encounter

## 2023-11-01 ENCOUNTER — Ambulatory Visit (HOSPITAL_BASED_OUTPATIENT_CLINIC_OR_DEPARTMENT_OTHER): Payer: Self-pay | Admitting: Physical Therapy

## 2023-11-01 DIAGNOSIS — M6281 Muscle weakness (generalized): Secondary | ICD-10-CM

## 2023-11-01 DIAGNOSIS — R2689 Other abnormalities of gait and mobility: Secondary | ICD-10-CM

## 2023-11-01 DIAGNOSIS — R29898 Other symptoms and signs involving the musculoskeletal system: Secondary | ICD-10-CM
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
## 2023-11-03 ENCOUNTER — Ambulatory Visit: Payer: PPO | Admitting: Family Medicine

## 2023-11-04 ENCOUNTER — Ambulatory Visit

## 2023-11-04 DIAGNOSIS — R2689 Other abnormalities of gait and mobility: Secondary | ICD-10-CM

## 2023-11-04 DIAGNOSIS — M6281 Muscle weakness (generalized): Secondary | ICD-10-CM | POA: Diagnosis not present

## 2023-11-04 DIAGNOSIS — R29898 Other symptoms and signs involving the musculoskeletal system: Secondary | ICD-10-CM
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
## 2023-11-06 ENCOUNTER — Other Ambulatory Visit: Payer: Self-pay

## 2023-11-08 ENCOUNTER — Ambulatory Visit

## 2023-11-08 DIAGNOSIS — M6281 Muscle weakness (generalized): Secondary | ICD-10-CM

## 2023-11-08 DIAGNOSIS — R29898 Other symptoms and signs involving the musculoskeletal system: Secondary | ICD-10-CM

## 2023-11-08 DIAGNOSIS — R2689 Other abnormalities of gait and mobility: Secondary | ICD-10-CM
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
## 2023-11-10 ENCOUNTER — Encounter

## 2023-11-14 ENCOUNTER — Encounter

## 2023-11-15 ENCOUNTER — Ambulatory Visit: Admitting: Family Medicine
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
## 2023-11-17 ENCOUNTER — Encounter: Payer: Self-pay | Admitting: Family Medicine

## 2023-11-17 ENCOUNTER — Ambulatory Visit (INDEPENDENT_AMBULATORY_CARE_PROVIDER_SITE_OTHER)

## 2023-11-17 ENCOUNTER — Ambulatory Visit: Attending: Family Medicine

## 2023-11-17 ENCOUNTER — Ambulatory Visit: Admitting: Family Medicine

## 2023-11-17 VITALS — BP 122/82 | HR 92 | Ht 64.0 in

## 2023-11-17 DIAGNOSIS — Z01812 Encounter for preprocedural laboratory examination: Secondary | ICD-10-CM | POA: Diagnosis not present

## 2023-11-17 DIAGNOSIS — S3210XD Unspecified fracture of sacrum, subsequent encounter for fracture with routine healing: Secondary | ICD-10-CM | POA: Diagnosis not present

## 2023-11-17 DIAGNOSIS — E559 Vitamin D deficiency, unspecified: Secondary | ICD-10-CM

## 2023-11-17 DIAGNOSIS — M84454D Pathological fracture, pelvis, subsequent encounter for fracture with routine healing: Secondary | ICD-10-CM | POA: Diagnosis not present

## 2023-11-17 DIAGNOSIS — R2689 Other abnormalities of gait and mobility: Secondary | ICD-10-CM

## 2023-11-17 DIAGNOSIS — M2550 Pain in unspecified joint: Secondary | ICD-10-CM

## 2023-11-17 DIAGNOSIS — M170 Bilateral primary osteoarthritis of knee: Secondary | ICD-10-CM

## 2023-11-17 DIAGNOSIS — E785 Hyperlipidemia, unspecified: Secondary | ICD-10-CM | POA: Diagnosis not present

## 2023-11-17 DIAGNOSIS — M6281 Muscle weakness (generalized): Secondary | ICD-10-CM

## 2023-11-17 DIAGNOSIS — S329XXD Fracture of unspecified parts of lumbosacral spine and pelvis, subsequent encounter for fracture with routine healing: Secondary | ICD-10-CM | POA: Diagnosis not present

## 2023-11-17 DIAGNOSIS — R29898 Other symptoms and signs involving the musculoskeletal system: Secondary | ICD-10-CM

## 2023-11-17 DIAGNOSIS — M47816 Spondylosis without myelopathy or radiculopathy, lumbar region: Secondary | ICD-10-CM | POA: Diagnosis not present

## 2023-11-17 LAB — CBC WITH DIFFERENTIAL/PLATELET
Basophils Absolute: 0 10*3/uL (ref 0.0–0.1)
Basophils Relative: 1 % (ref 0.0–3.0)
Eosinophils Absolute: 0.1 10*3/uL (ref 0.0–0.7)
Eosinophils Relative: 1.1 % (ref 0.0–5.0)
HCT: 36.5 % (ref 36.0–46.0)
Hemoglobin: 12.6 g/dL (ref 12.0–15.0)
Lymphocytes Relative: 27.9 % (ref 12.0–46.0)
Lymphs Abs: 1.2 10*3/uL (ref 0.7–4.0)
MCHC: 34.6 g/dL (ref 30.0–36.0)
MCV: 94.9 fl (ref 78.0–100.0)
Monocytes Absolute: 0.3 10*3/uL (ref 0.1–1.0)
Monocytes Relative: 7.2 % (ref 3.0–12.0)
Neutro Abs: 2.8 10*3/uL (ref 1.4–7.7)
Neutrophils Relative %: 62.8 % (ref 43.0–77.0)
Platelets: 186 10*3/uL (ref 150.0–400.0)
RBC: 3.85 Mil/uL — ABNORMAL LOW (ref 3.87–5.11)
RDW: 13.1 % (ref 11.5–15.5)
WBC: 4.5 10*3/uL (ref 4.0–10.5)

## 2023-11-17 LAB — SEDIMENTATION RATE: Sed Rate: 14 mm/h (ref 0–30)
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
## 2023-11-17 NOTE — Patient Instructions (Addendum)
 Injection in knees today See you again in 2 months Labs today

## 2023-11-17 NOTE — Assessment & Plan Note (Signed)
 Significant insufficiency fracture.  X-rays do show some callus formation noted.  Seems to be making progress but is extremely slow.  Discussed with patient at great length about different treatment options.  Would like to get another autoimmune workup which was done approximately 2 years ago.  Patient does have a strong family history of multiple people already having multiple back surgeries in their 12s as well as hip replacement.  Would like to consider the possible referrals to endocrinology as well as rheumatology.  Make sure we are not missing anything else that could be contributing.  Continue with physical therapy but will do 20 pounds for 3 weeks and then 30 pounds for 3 weeks with patient and following up with me again in 8 weeks.

## 2023-11-17 NOTE — Assessment & Plan Note (Addendum)
 Injections given today and tolerated the procedure well, discussed icing regimen and home exercises, increase activity slowly.  Has responded previously to viscosupplementation and could consider it again in 2 months

## 2023-11-18 LAB — COMPREHENSIVE METABOLIC PANEL WITH GFR
ALT: 17 U/L (ref 0–35)
AST: 20 U/L (ref 0–37)
Albumin: 4.6 g/dL (ref 3.5–5.2)
Alkaline Phosphatase: 38 U/L — ABNORMAL LOW (ref 39–117)
BUN: 21 mg/dL (ref 6–23)
CO2: 24 meq/L (ref 19–32)
Calcium: 9.6 mg/dL (ref 8.4–10.5)
Chloride: 99 meq/L (ref 96–112)
Creatinine, Ser: 0.84 mg/dL (ref 0.40–1.20)
GFR: 71 mL/min (ref >60.00)
Glucose, Bld: 88 mg/dL (ref 70–99)
Potassium: 4.2 meq/L (ref 3.5–5.1)
Sodium: 135 meq/L (ref 135–145)
Total Bilirubin: 0.5 mg/dL (ref 0.2–1.2)
Total Protein: 7.2 g/dL (ref 6.0–8.3)

## 2023-11-18 LAB — LIPID PANEL
Cholesterol: 219 mg/dL — ABNORMAL HIGH (ref 0–200)
HDL: 63.4 mg/dL (ref >39.00)
LDL Cholesterol: 140 mg/dL — ABNORMAL HIGH (ref 0–99)
NonHDL: 155.42
Total CHOL/HDL Ratio: 3
Triglycerides: 75 mg/dL (ref 0.0–149.0)
VLDL: 15 mg/dL (ref 0.0–40.0)

## 2023-11-18 LAB — VITAMIN B12: Vitamin B-12: 722 pg/mL (ref 211–911)

## 2023-11-18 LAB — VITAMIN D 25 HYDROXY (VIT D DEFICIENCY, FRACTURES): VITD: 71.07 ng/mL (ref 30.00–100.00)

## 2023-11-18 LAB — URIC ACID: Uric Acid, Serum: 3.1 mg/dL (ref 2.4–7.0)

## 2023-11-18 LAB — PTH, INTACT AND CALCIUM
Calcium: 9.3 mg/dL (ref 8.6–10.4)
PTH: 35 pg/mL (ref 16–77)

## 2023-11-18 LAB — FERRITIN: Ferritin: 93.7 ng/mL (ref 10.0–291.0)

## 2023-11-19 ENCOUNTER — Ambulatory Visit: Payer: Self-pay | Admitting: Family Medicine

## 2023-11-19 LAB — LACTATE DEHYDROGENASE: LDH: 232 U/L (ref 120–250)

## 2023-11-19 LAB — SPECIMEN COMPROMISED

## 2023-11-20 LAB — PROTEIN ELECTROPHORESIS, SERUM
Albumin ELP: 4.6 g/dL (ref 3.8–4.8)
Alpha 1: 0.3 g/dL (ref 0.2–0.3)
Alpha 2: 0.6 g/dL (ref 0.5–0.9)
Beta 2: 0.3 g/dL (ref 0.2–0.5)
Beta Globulin: 0.4 g/dL (ref 0.4–0.6)
Gamma Globulin: 0.9 g/dL (ref 0.8–1.7)
Total Protein: 7.1 g/dL (ref 6.1–8.1)

## 2023-11-20 LAB — ANGIOTENSIN CONVERTING ENZYME: Angiotensin-Converting Enzyme: 25 U/L (ref 9–67)

## 2023-11-20 LAB — CALCIUM, IONIZED: Calcium, Ion: 5.2 mg/dL (ref 4.7–5.5)

## 2023-11-20 LAB — ANA: Anti Nuclear Antibody (ANA): NEGATIVE

## 2023-11-21 ENCOUNTER — Ambulatory Visit: Payer: Self-pay | Admitting: Family Medicine

## 2023-11-21 ENCOUNTER — Ambulatory Visit (HOSPITAL_BASED_OUTPATIENT_CLINIC_OR_DEPARTMENT_OTHER): Payer: Self-pay | Attending: Family Medicine | Admitting: Physical Therapy

## 2023-11-21 DIAGNOSIS — M6281 Muscle weakness (generalized): Secondary | ICD-10-CM

## 2023-11-21 DIAGNOSIS — R2689 Other abnormalities of gait and mobility: Secondary | ICD-10-CM | POA: Diagnosis not present

## 2023-11-21 DIAGNOSIS — R29898 Other symptoms and signs involving the musculoskeletal system: Secondary | ICD-10-CM | POA: Diagnosis not present
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
## 2023-11-23 ENCOUNTER — Ambulatory Visit: Admitting: Family Medicine

## 2023-11-24 ENCOUNTER — Ambulatory Visit

## 2023-11-24 DIAGNOSIS — M6281 Muscle weakness (generalized): Secondary | ICD-10-CM

## 2023-11-24 DIAGNOSIS — R29898 Other symptoms and signs involving the musculoskeletal system: Secondary | ICD-10-CM

## 2023-11-24 DIAGNOSIS — R2689 Other abnormalities of gait and mobility: Secondary | ICD-10-CM
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
## 2023-12-05 ENCOUNTER — Encounter (HOSPITAL_BASED_OUTPATIENT_CLINIC_OR_DEPARTMENT_OTHER): Payer: Self-pay | Admitting: Physical Therapy

## 2023-12-05 ENCOUNTER — Ambulatory Visit (HOSPITAL_BASED_OUTPATIENT_CLINIC_OR_DEPARTMENT_OTHER): Admitting: Physical Therapy

## 2023-12-05 DIAGNOSIS — R2689 Other abnormalities of gait and mobility: Secondary | ICD-10-CM

## 2023-12-05 DIAGNOSIS — M6281 Muscle weakness (generalized): Secondary | ICD-10-CM | POA: Diagnosis not present

## 2023-12-05 DIAGNOSIS — R29898 Other symptoms and signs involving the musculoskeletal system: Secondary | ICD-10-CM
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
## 2023-12-12 ENCOUNTER — Ambulatory Visit (HOSPITAL_BASED_OUTPATIENT_CLINIC_OR_DEPARTMENT_OTHER): Admitting: Physical Therapy

## 2023-12-12 ENCOUNTER — Encounter (HOSPITAL_BASED_OUTPATIENT_CLINIC_OR_DEPARTMENT_OTHER): Payer: Self-pay | Admitting: Physical Therapy

## 2023-12-12 DIAGNOSIS — R2689 Other abnormalities of gait and mobility: Secondary | ICD-10-CM

## 2023-12-12 DIAGNOSIS — R29898 Other symptoms and signs involving the musculoskeletal system: Secondary | ICD-10-CM

## 2023-12-12 DIAGNOSIS — M6281 Muscle weakness (generalized): Secondary | ICD-10-CM
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
## 2023-12-13 ENCOUNTER — Telehealth: Payer: Self-pay

## 2023-12-13 NOTE — Telephone Encounter (Signed)
-----   Message from [name] sent at 11/17/2023  4:03 PM EDT ----- Regarding: Gel approval rerun Run Gel for Shannon [name] again beginning of July please

## 2023-12-13 NOTE — Telephone Encounter (Signed)
 Patient reran for Monovisc for bilateral knees on 12/13/2023. Case # : [id number]. Pending approval.

## 2023-12-14 ENCOUNTER — Encounter: Payer: Self-pay | Admitting: Family Medicine

## 2023-12-14 ENCOUNTER — Other Ambulatory Visit: Payer: Self-pay

## 2023-12-14 DIAGNOSIS — M810 Age-related osteoporosis without current pathological fracture: Secondary | ICD-10-CM

## 2023-12-14 DIAGNOSIS — M8440XA Pathological fracture, unspecified site, initial encounter for fracture: Secondary | ICD-10-CM

## 2023-12-14 NOTE — Telephone Encounter (Signed)
 Patient needs an appointment once medication is in stock.   Monovisc for bilateral knees approved.  Deductible does not apply. Once OOP has been met, the patient is covered at 100%. Only one copay applies per date of service. Prior Authorization is not required.   Case #: [id number] Exp: 06/15/24

## 2023-12-15 ENCOUNTER — Ambulatory Visit: Attending: Family Medicine

## 2023-12-15 DIAGNOSIS — R2689 Other abnormalities of gait and mobility: Secondary | ICD-10-CM | POA: Diagnosis not present

## 2023-12-15 DIAGNOSIS — R29898 Other symptoms and signs involving the musculoskeletal system: Secondary | ICD-10-CM | POA: Diagnosis not present

## 2023-12-15 DIAGNOSIS — M6281 Muscle weakness (generalized): Secondary | ICD-10-CM | POA: Insufficient documentation
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
## 2023-12-15 NOTE — Telephone Encounter (Signed)
Pt scheduled for 8/6.

## 2023-12-19 ENCOUNTER — Ambulatory Visit (HOSPITAL_BASED_OUTPATIENT_CLINIC_OR_DEPARTMENT_OTHER): Attending: Family Medicine | Admitting: Physical Therapy

## 2023-12-19 ENCOUNTER — Encounter (HOSPITAL_BASED_OUTPATIENT_CLINIC_OR_DEPARTMENT_OTHER): Payer: Self-pay | Admitting: Physical Therapy

## 2023-12-19 DIAGNOSIS — R29898 Other symptoms and signs involving the musculoskeletal system: Secondary | ICD-10-CM | POA: Insufficient documentation

## 2023-12-19 DIAGNOSIS — M6281 Muscle weakness (generalized): Secondary | ICD-10-CM | POA: Insufficient documentation

## 2023-12-19 DIAGNOSIS — R2689 Other abnormalities of gait and mobility: Secondary | ICD-10-CM | POA: Insufficient documentation
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
## 2023-12-22 ENCOUNTER — Ambulatory Visit

## 2023-12-22 DIAGNOSIS — M6281 Muscle weakness (generalized): Secondary | ICD-10-CM | POA: Diagnosis not present

## 2023-12-22 DIAGNOSIS — R2689 Other abnormalities of gait and mobility: Secondary | ICD-10-CM

## 2023-12-22 DIAGNOSIS — R29898 Other symptoms and signs involving the musculoskeletal system: Secondary | ICD-10-CM
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
## 2023-12-26 ENCOUNTER — Ambulatory Visit (HOSPITAL_BASED_OUTPATIENT_CLINIC_OR_DEPARTMENT_OTHER): Admitting: Physical Therapy

## 2023-12-26 ENCOUNTER — Encounter (HOSPITAL_BASED_OUTPATIENT_CLINIC_OR_DEPARTMENT_OTHER): Payer: Self-pay | Admitting: Physical Therapy

## 2023-12-26 DIAGNOSIS — R29898 Other symptoms and signs involving the musculoskeletal system: Secondary | ICD-10-CM

## 2023-12-26 DIAGNOSIS — M6281 Muscle weakness (generalized): Secondary | ICD-10-CM

## 2023-12-26 DIAGNOSIS — R2689 Other abnormalities of gait and mobility: Secondary | ICD-10-CM | POA: Diagnosis not present
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
## 2023-12-27 DIAGNOSIS — Z129 Encounter for screening for malignant neoplasm, site unspecified: Secondary | ICD-10-CM | POA: Diagnosis not present

## 2023-12-27 DIAGNOSIS — L7211 Pilar cyst: Secondary | ICD-10-CM | POA: Diagnosis not present

## 2023-12-27 DIAGNOSIS — D225 Melanocytic nevi of trunk: Secondary | ICD-10-CM | POA: Diagnosis not present

## 2023-12-27 DIAGNOSIS — L821 Other seborrheic keratosis: Secondary | ICD-10-CM | POA: Diagnosis not present

## 2023-12-27 DIAGNOSIS — L720 Epidermal cyst: Secondary | ICD-10-CM | POA: Diagnosis not present

## 2023-12-29 ENCOUNTER — Ambulatory Visit

## 2023-12-29 DIAGNOSIS — M6281 Muscle weakness (generalized): Secondary | ICD-10-CM

## 2023-12-29 DIAGNOSIS — R2689 Other abnormalities of gait and mobility: Secondary | ICD-10-CM

## 2023-12-29 DIAGNOSIS — R29898 Other symptoms and signs involving the musculoskeletal system: Secondary | ICD-10-CM
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
## 2024-01-02 ENCOUNTER — Encounter (HOSPITAL_BASED_OUTPATIENT_CLINIC_OR_DEPARTMENT_OTHER): Payer: Self-pay | Admitting: Physical Therapy

## 2024-01-02 ENCOUNTER — Ambulatory Visit (HOSPITAL_BASED_OUTPATIENT_CLINIC_OR_DEPARTMENT_OTHER): Admitting: Physical Therapy

## 2024-01-02 DIAGNOSIS — R2689 Other abnormalities of gait and mobility: Secondary | ICD-10-CM

## 2024-01-02 DIAGNOSIS — R29898 Other symptoms and signs involving the musculoskeletal system: Secondary | ICD-10-CM

## 2024-01-02 DIAGNOSIS — M6281 Muscle weakness (generalized): Secondary | ICD-10-CM | POA: Diagnosis not present
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
## 2024-01-04 ENCOUNTER — Other Ambulatory Visit: Payer: Self-pay | Admitting: Family Medicine

## 2024-01-05 ENCOUNTER — Other Ambulatory Visit (HOSPITAL_BASED_OUTPATIENT_CLINIC_OR_DEPARTMENT_OTHER): Payer: Self-pay

## 2024-01-05 MED ORDER — TIZANIDINE HCL 2 MG PO TABS
1.0000 mg | ORAL_TABLET | Freq: Three times a day (TID) | ORAL | 2 refills | Status: AC | PRN
  Filled 2024-01-05: qty 30, 5d supply, fill #0
  Filled 2024-03-16: qty 30, 5d supply, fill #1
  Filled 2024-07-18: qty 30, 5d supply, fill #2

## 2024-01-05 MED ORDER — TEMAZEPAM 15 MG PO CAPS
15.0000 mg | ORAL_CAPSULE | Freq: Every evening | ORAL | 1 refills | Status: DC | PRN
  Filled 2024-01-05: qty 30, 30d supply, fill #0
  Filled 2024-03-05: qty 30, 30d supply, fill #1

## 2024-01-05 NOTE — Telephone Encounter (Signed)
 Requesting: temazepam  15mg   Contract: None LID:[email or web address]  Last Visit: 08/18/23 Next Visit: 01/26/24 Last Refill: 09/01/23 #30 and 0RF  Please Advise

## 2024-01-12 ENCOUNTER — Ambulatory Visit

## 2024-01-12 DIAGNOSIS — M6281 Muscle weakness (generalized): Secondary | ICD-10-CM | POA: Diagnosis not present

## 2024-01-12 DIAGNOSIS — R29898 Other symptoms and signs involving the musculoskeletal system: Secondary | ICD-10-CM

## 2024-01-12 DIAGNOSIS — R2689 Other abnormalities of gait and mobility: Secondary | ICD-10-CM
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
## 2024-01-16 ENCOUNTER — Encounter (HOSPITAL_BASED_OUTPATIENT_CLINIC_OR_DEPARTMENT_OTHER): Payer: Self-pay | Admitting: Physical Therapy

## 2024-01-16 ENCOUNTER — Ambulatory Visit (HOSPITAL_BASED_OUTPATIENT_CLINIC_OR_DEPARTMENT_OTHER): Attending: Family Medicine | Admitting: Physical Therapy

## 2024-01-16 DIAGNOSIS — R2689 Other abnormalities of gait and mobility: Secondary | ICD-10-CM | POA: Insufficient documentation

## 2024-01-16 DIAGNOSIS — M6281 Muscle weakness (generalized): Secondary | ICD-10-CM | POA: Insufficient documentation

## 2024-01-16 DIAGNOSIS — R29898 Other symptoms and signs involving the musculoskeletal system: Secondary | ICD-10-CM | POA: Diagnosis not present
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
## 2024-01-18 ENCOUNTER — Ambulatory Visit: Admitting: Family Medicine

## 2024-01-18 ENCOUNTER — Encounter: Payer: Self-pay | Admitting: Family Medicine

## 2024-01-18 VITALS — BP 126/70 | HR 70 | Ht 64.0 in | Wt 134.0 lb

## 2024-01-18 DIAGNOSIS — M170 Bilateral primary osteoarthritis of knee: Secondary | ICD-10-CM

## 2024-01-18 MED ORDER — HYALURONAN 88 MG/4ML IX SOSY
176.0000 mg | PREFILLED_SYRINGE | Freq: Once | INTRA_ARTICULAR | Status: AC
  Administered 2024-01-18: 176 mg via INTRA_ARTICULAR

## 2024-01-18 NOTE — Patient Instructions (Signed)
 Monovisc injections today Check about acupuncture See me in 3 months

## 2024-01-18 NOTE — Assessment & Plan Note (Signed)
 Patient given injection and tolerated this procedure well, discussed icing regimen of home exercises, discussed which activities to do in which ones to avoid.  Increase your activity slowly.  Patient has done well with this previously and hopeful again.  Follow-up again in 6 to 8 weeks

## 2024-01-19 ENCOUNTER — Ambulatory Visit: Attending: Family Medicine

## 2024-01-19 DIAGNOSIS — R2689 Other abnormalities of gait and mobility: Secondary | ICD-10-CM | POA: Insufficient documentation

## 2024-01-19 DIAGNOSIS — R29898 Other symptoms and signs involving the musculoskeletal system: Secondary | ICD-10-CM | POA: Insufficient documentation

## 2024-01-19 DIAGNOSIS — M6281 Muscle weakness (generalized): Secondary | ICD-10-CM | POA: Insufficient documentation
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
## 2024-01-23 ENCOUNTER — Ambulatory Visit (HOSPITAL_BASED_OUTPATIENT_CLINIC_OR_DEPARTMENT_OTHER): Admitting: Physical Therapy

## 2024-01-23 ENCOUNTER — Encounter (HOSPITAL_BASED_OUTPATIENT_CLINIC_OR_DEPARTMENT_OTHER): Payer: Self-pay | Admitting: Physical Therapy

## 2024-01-23 DIAGNOSIS — M6281 Muscle weakness (generalized): Secondary | ICD-10-CM | POA: Diagnosis not present

## 2024-01-23 DIAGNOSIS — R29898 Other symptoms and signs involving the musculoskeletal system: Secondary | ICD-10-CM

## 2024-01-23 DIAGNOSIS — R2689 Other abnormalities of gait and mobility: Secondary | ICD-10-CM
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
## 2024-01-23 NOTE — Assessment & Plan Note (Signed)
 Supplement and monitor, increase daily supplement by 1000 IU daily

## 2024-01-23 NOTE — Assessment & Plan Note (Signed)
 Asymptomatic and mild, improving

## 2024-01-23 NOTE — Assessment & Plan Note (Signed)
 Encourage heart healthy diet such as MIND or DASH diet, increase exercise, avoid trans fats, simple carbohydrates and processed foods, consider a krill or fish or flaxseed oil cap daily.

## 2024-01-26 ENCOUNTER — Other Ambulatory Visit: Payer: Self-pay

## 2024-01-26 ENCOUNTER — Encounter: Payer: Self-pay | Admitting: Family Medicine

## 2024-01-26 ENCOUNTER — Ambulatory Visit

## 2024-01-26 ENCOUNTER — Ambulatory Visit (INDEPENDENT_AMBULATORY_CARE_PROVIDER_SITE_OTHER): Admitting: Family Medicine

## 2024-01-26 VITALS — BP 122/70 | HR 75 | Temp 97.9°F | Resp 17 | Ht 64.0 in | Wt 135.4 lb

## 2024-01-26 DIAGNOSIS — M8580 Other specified disorders of bone density and structure, unspecified site: Secondary | ICD-10-CM | POA: Diagnosis not present

## 2024-01-26 DIAGNOSIS — M6281 Muscle weakness (generalized): Secondary | ICD-10-CM

## 2024-01-26 DIAGNOSIS — M84454D Pathological fracture, pelvis, subsequent encounter for fracture with routine healing: Secondary | ICD-10-CM

## 2024-01-26 DIAGNOSIS — Z1231 Encounter for screening mammogram for malignant neoplasm of breast: Secondary | ICD-10-CM | POA: Diagnosis not present

## 2024-01-26 DIAGNOSIS — F419 Anxiety disorder, unspecified: Secondary | ICD-10-CM | POA: Diagnosis not present

## 2024-01-26 DIAGNOSIS — Z0000 Encounter for general adult medical examination without abnormal findings: Secondary | ICD-10-CM | POA: Diagnosis not present

## 2024-01-26 DIAGNOSIS — Z79899 Other long term (current) drug therapy: Secondary | ICD-10-CM

## 2024-01-26 DIAGNOSIS — M25561 Pain in right knee: Secondary | ICD-10-CM | POA: Diagnosis not present

## 2024-01-26 DIAGNOSIS — R29898 Other symptoms and signs involving the musculoskeletal system: Secondary | ICD-10-CM

## 2024-01-26 DIAGNOSIS — D696 Thrombocytopenia, unspecified: Secondary | ICD-10-CM

## 2024-01-26 DIAGNOSIS — E785 Hyperlipidemia, unspecified: Secondary | ICD-10-CM

## 2024-01-26 DIAGNOSIS — E559 Vitamin D deficiency, unspecified: Secondary | ICD-10-CM | POA: Diagnosis not present

## 2024-01-26 DIAGNOSIS — R2689 Other abnormalities of gait and mobility: Secondary | ICD-10-CM

## 2024-01-26 DIAGNOSIS — Z01818 Encounter for other preprocedural examination: Secondary | ICD-10-CM
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
## 2024-01-26 NOTE — Patient Instructions (Signed)
 Prevnar 20 at pharmacy RSV, Respiratory Syncitial Virus vaccine, Arexvy vaccine at pharmacy  Preventive Care 65 Years and Older, Female Preventive care refers to lifestyle choices and visits with your health care provider that can promote health and wellness. Preventive care visits are also called wellness exams. What can I expect for my preventive care visit? Counseling Your health care provider may ask you questions about your: Medical history, including: Past medical problems. Family medical history. Pregnancy and menstrual history. History of falls. Current health, including: Memory and ability to understand (cognition). Emotional well-being. Home life and relationship well-being. Sexual activity and sexual health. Lifestyle, including: Alcohol, nicotine or tobacco, and drug use. Access to firearms. Diet, exercise, and sleep habits. Work and work Astronomer. Sunscreen use. Safety issues such as seatbelt and bike helmet use. Physical exam Your health care provider will check your: Height and weight. These may be used to calculate your BMI (body mass index). BMI is a measurement that tells if you are at a healthy weight. Waist circumference. This measures the distance around your waistline. This measurement also tells if you are at a healthy weight and may help predict your risk of certain diseases, such as type 2 diabetes and high blood pressure. Heart rate and blood pressure. Body temperature. Skin for abnormal spots. What immunizations do I need?  Vaccines are usually given at various ages, according to a schedule. Your health care provider will recommend vaccines for you based on your age, medical history, and lifestyle or other factors, such as travel or where you work. What tests do I need? Screening Your health care provider may recommend screening tests for certain conditions. This may include: Lipid and cholesterol levels. Hepatitis C test. Hepatitis B test. HIV  (human immunodeficiency virus) test. STI (sexually transmitted infection) testing, if you are at risk. Lung cancer screening. Colorectal cancer screening. Diabetes screening. This is done by checking your blood sugar (glucose) after you have not eaten for a while (fasting). Mammogram. Talk with your health care provider about how often you should have regular mammograms. BRCA-related cancer screening. This may be done if you have a family history of breast, ovarian, tubal, or peritoneal cancers. Bone density scan. This is done to screen for osteoporosis. Talk with your health care provider about your test results, treatment options, and if necessary, the need for more tests. Follow these instructions at home: Eating and drinking  Eat a diet that includes fresh fruits and vegetables, whole grains, lean protein, and low-fat dairy products. Limit your intake of foods with high amounts of sugar, saturated fats, and salt. Take vitamin and mineral supplements as recommended by your health care provider. Do not drink alcohol if your health care provider tells you not to drink. If you drink alcohol: Limit how much you have to 0-1 drink a day. Know how much alcohol is in your drink. In the [place] one drink equals one 12 oz bottle of beer (355 mL), one 5 oz glass of wine (148 mL), or one 1 oz glass of hard liquor (44 mL). Lifestyle Brush your teeth every morning and night with fluoride toothpaste. Floss one time each day. Exercise for at least 30 minutes 5 or more days each week. Do not use any products that contain nicotine or tobacco. These products include cigarettes, chewing tobacco, and vaping devices, such as e-cigarettes. If you need help quitting, ask your health care provider. Do not use drugs. If you are sexually active, practice safe sex. Use a condom or other  form of protection in order to prevent STIs. Take aspirin only as told by your health care provider. Make sure that you understand  how much to take and what form to take. Work with your health care provider to find out whether it is safe and beneficial for you to take aspirin daily. Ask your health care provider if you need to take a cholesterol-lowering medicine (statin). Find healthy ways to manage stress, such as: Meditation, yoga, or listening to music. Journaling. Talking to a trusted person. Spending time with friends and family. Minimize exposure to UV radiation to reduce your risk of skin cancer. Safety Always wear your seat belt while driving or riding in a vehicle. Do not drive: If you have been drinking alcohol. Do not ride with someone who has been drinking. When you are tired or distracted. While texting. If you have been using any mind-altering substances or drugs. Wear a helmet and other protective equipment during sports activities. If you have firearms in your house, make sure you follow all gun safety procedures. What's next? Visit your health care provider once a year for an annual wellness visit. Ask your health care provider how often you should have your eyes and teeth checked. Stay up to date on all vaccines. This information is not intended to replace advice given to you by your health care provider. Make sure you discuss any questions you have with your health care provider. Document Revised: 11/26/2020 Document Reviewed: 11/26/2020 Elsevier Patient Education  2024 [employer].

## 2024-01-26 NOTE — Assessment & Plan Note (Signed)
 Patient encouraged to maintain heart healthy diet, regular exercise, adequate sleep. Consider daily probiotics. Take medications as prescribed  Colonoscopy 2023 repeat in 022028 MGM due, ordered Dexa in December 2026 Pap 2022 normal. Has aged out
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
## 2024-01-27 ENCOUNTER — Encounter: Payer: Self-pay | Admitting: Family Medicine

## 2024-01-27 ENCOUNTER — Other Ambulatory Visit (HOSPITAL_BASED_OUTPATIENT_CLINIC_OR_DEPARTMENT_OTHER): Payer: Self-pay

## 2024-01-27 ENCOUNTER — Other Ambulatory Visit: Payer: Self-pay | Admitting: Family Medicine

## 2024-01-27 LAB — CBC WITH DIFFERENTIAL/PLATELET
Basophils Absolute: 0 K/uL (ref 0.0–0.1)
Basophils Relative: 0.7 % (ref 0.0–3.0)
Eosinophils Absolute: 0 K/uL (ref 0.0–0.7)
Eosinophils Relative: 0.9 % (ref 0.0–5.0)
HCT: 38 % (ref 36.0–46.0)
Hemoglobin: 12.8 g/dL (ref 12.0–15.0)
Lymphocytes Relative: 22.1 % (ref 12.0–46.0)
Lymphs Abs: 1.2 K/uL (ref 0.7–4.0)
MCHC: 33.6 g/dL (ref 30.0–36.0)
MCV: 96.3 fl (ref 78.0–100.0)
Monocytes Absolute: 0.4 K/uL (ref 0.1–1.0)
Monocytes Relative: 6.9 % (ref 3.0–12.0)
Neutro Abs: 3.9 K/uL (ref 1.4–7.7)
Neutrophils Relative %: 69.4 % (ref 43.0–77.0)
Platelets: 169 K/uL (ref 150.0–400.0)
RBC: 3.94 Mil/uL (ref 3.87–5.11)
RDW: 13.9 % (ref 11.5–15.5)
WBC: 5.6 K/uL (ref 4.0–10.5)

## 2024-01-27 LAB — LIPID PANEL
Cholesterol: 257 mg/dL — ABNORMAL HIGH (ref 0–200)
HDL: 65 mg/dL (ref >39.00)
LDL Cholesterol: 175 mg/dL — ABNORMAL HIGH (ref 0–99)
NonHDL: 191.53
Total CHOL/HDL Ratio: 4
Triglycerides: 81 mg/dL (ref 0.0–149.0)
VLDL: 16.2 mg/dL (ref 0.0–40.0)

## 2024-01-27 LAB — COMPREHENSIVE METABOLIC PANEL WITH GFR
ALT: 16 U/L (ref 0–35)
AST: 20 U/L (ref 0–37)
Albumin: 4.3 g/dL (ref 3.5–5.2)
Alkaline Phosphatase: 33 U/L — ABNORMAL LOW (ref 39–117)
BUN: 27 mg/dL — ABNORMAL HIGH (ref 6–23)
CO2: 28 meq/L (ref 19–32)
Calcium: 9.2 mg/dL (ref 8.4–10.5)
Chloride: 97 meq/L (ref 96–112)
Creatinine, Ser: 0.59 mg/dL (ref 0.40–1.20)
GFR: 91.95 mL/min (ref >60.00)
Glucose, Bld: 78 mg/dL (ref 70–99)
Potassium: 3.7 meq/L (ref 3.5–5.1)
Sodium: 134 meq/L — ABNORMAL LOW (ref 135–145)
Total Bilirubin: 0.5 mg/dL (ref 0.2–1.2)
Total Protein: 6.6 g/dL (ref 6.0–8.3)

## 2024-01-27 LAB — VITAMIN D 25 HYDROXY (VIT D DEFICIENCY, FRACTURES): VITD: 45.44 ng/mL (ref 30.00–100.00)

## 2024-01-27 LAB — TSH: TSH: 1.2 u[IU]/mL (ref 0.35–5.50)

## 2024-01-27 MED ORDER — ALPRAZOLAM 0.25 MG PO TABS
0.2500 mg | ORAL_TABLET | Freq: Two times a day (BID) | ORAL | 1 refills | Status: AC | PRN
  Filled 2024-01-27: qty 20, 10d supply, fill #0
  Filled 2024-03-16: qty 20, 10d supply, fill #1

## 2024-01-27 NOTE — Telephone Encounter (Signed)
 Requesting: Xanax  0.5 MG Contract: No UDS: No Last Visit: 01/26/2024 Next Visit: 07/30/24 Last Refill: 07/13/23  Please Advise

## 2024-01-28 ENCOUNTER — Encounter: Payer: Self-pay | Admitting: Family Medicine

## 2024-01-29 ENCOUNTER — Ambulatory Visit: Payer: Self-pay | Admitting: Family Medicine

## 2024-01-30 ENCOUNTER — Ambulatory Visit (HOSPITAL_BASED_OUTPATIENT_CLINIC_OR_DEPARTMENT_OTHER): Admitting: Physical Therapy

## 2024-01-30 ENCOUNTER — Encounter (HOSPITAL_BASED_OUTPATIENT_CLINIC_OR_DEPARTMENT_OTHER): Payer: Self-pay

## 2024-01-31 ENCOUNTER — Other Ambulatory Visit (INDEPENDENT_AMBULATORY_CARE_PROVIDER_SITE_OTHER)

## 2024-01-31 DIAGNOSIS — Z01818 Encounter for other preprocedural examination: Secondary | ICD-10-CM | POA: Diagnosis not present

## 2024-01-31 LAB — PROTIME-INR
INR: 1.1 ratio — ABNORMAL HIGH (ref 0.8–1.0)
Prothrombin Time: 11.5 s (ref 9.6–13.1)

## 2024-01-31 NOTE — Telephone Encounter (Signed)
 PT INR results back as of 01/31/24

## 2024-02-02 ENCOUNTER — Ambulatory Visit

## 2024-02-02 NOTE — Telephone Encounter (Signed)
 Labs faxed

## 2024-02-14 ENCOUNTER — Other Ambulatory Visit (HOSPITAL_BASED_OUTPATIENT_CLINIC_OR_DEPARTMENT_OTHER): Payer: Self-pay

## 2024-02-14 DIAGNOSIS — L7212 Trichodermal cyst: Secondary | ICD-10-CM | POA: Diagnosis not present

## 2024-02-14 MED ORDER — DOXYCYCLINE HYCLATE 100 MG PO CAPS
100.0000 mg | ORAL_CAPSULE | Freq: Two times a day (BID) | ORAL | 0 refills | Status: AC
  Filled 2024-02-14: qty 10, 5d supply, fill #0

## 2024-02-16 ENCOUNTER — Ambulatory Visit (HOSPITAL_BASED_OUTPATIENT_CLINIC_OR_DEPARTMENT_OTHER)
Admission: RE | Admit: 2024-02-16 | Discharge: 2024-02-16 | Disposition: A | Discharge status: Home / Self Care | Source: Ambulatory Visit | Attending: Family Medicine | Admitting: Family Medicine

## 2024-02-16 ENCOUNTER — Encounter (HOSPITAL_BASED_OUTPATIENT_CLINIC_OR_DEPARTMENT_OTHER): Payer: Self-pay

## 2024-02-16 DIAGNOSIS — Z1231 Encounter for screening mammogram for malignant neoplasm of breast: Secondary | ICD-10-CM | POA: Insufficient documentation

## 2024-02-21 ENCOUNTER — Encounter: Payer: Self-pay | Admitting: Family Medicine

## 2024-02-21 DIAGNOSIS — R928 Other abnormal and inconclusive findings on diagnostic imaging of breast: Secondary | ICD-10-CM

## 2024-02-28 ENCOUNTER — Ambulatory Visit
Admission: RE | Admit: 2024-02-28 | Discharge: 2024-02-28 | Disposition: A | Discharge status: Home / Self Care | Source: Ambulatory Visit | Attending: Family Medicine | Admitting: Family Medicine

## 2024-02-28 ENCOUNTER — Ambulatory Visit

## 2024-02-28 DIAGNOSIS — R928 Other abnormal and inconclusive findings on diagnostic imaging of breast: Secondary | ICD-10-CM | POA: Diagnosis not present

## 2024-03-05 ENCOUNTER — Other Ambulatory Visit: Payer: Self-pay

## 2024-03-05 ENCOUNTER — Other Ambulatory Visit (HOSPITAL_BASED_OUTPATIENT_CLINIC_OR_DEPARTMENT_OTHER): Payer: Self-pay

## 2024-03-05 MED ORDER — ONDANSETRON HCL 4 MG PO TABS
4.0000 mg | ORAL_TABLET | Freq: Three times a day (TID) | ORAL | 0 refills | Status: DC | PRN
  Filled 2024-03-05: qty 10, 4d supply, fill #0

## 2024-03-05 MED ORDER — CLINDAMYCIN HCL 300 MG PO CAPS
300.0000 mg | ORAL_CAPSULE | Freq: Three times a day (TID) | ORAL | 0 refills | Status: DC
  Filled 2024-03-05: qty 30, 10d supply, fill #0

## 2024-03-05 MED ORDER — CLONIDINE HCL 0.1 MG PO TABS
0.1000 mg | ORAL_TABLET | ORAL | 0 refills | Status: DC
  Filled 2024-03-05: qty 3, 2d supply, fill #0

## 2024-03-05 MED ORDER — SCOPOLAMINE 1 MG/3DAYS TD PT72
1.0000 | MEDICATED_PATCH | TRANSDERMAL | 0 refills | Status: DC
  Filled 2024-03-05: qty 1, 3d supply, fill #0

## 2024-03-06 ENCOUNTER — Other Ambulatory Visit (HOSPITAL_BASED_OUTPATIENT_CLINIC_OR_DEPARTMENT_OTHER): Payer: Self-pay

## 2024-03-06 DIAGNOSIS — M810 Age-related osteoporosis without current pathological fracture: Secondary | ICD-10-CM | POA: Diagnosis not present

## 2024-03-06 DIAGNOSIS — M19012 Primary osteoarthritis, left shoulder: Secondary | ICD-10-CM | POA: Diagnosis not present

## 2024-03-06 DIAGNOSIS — M84454D Pathological fracture, pelvis, subsequent encounter for fracture with routine healing: Secondary | ICD-10-CM | POA: Diagnosis not present

## 2024-03-14 HISTORY — PX: EYE SURGERY: SHX253

## 2024-03-15 ENCOUNTER — Encounter: Payer: Self-pay | Admitting: Family Medicine

## 2024-03-16 ENCOUNTER — Other Ambulatory Visit: Payer: Self-pay

## 2024-03-21 ENCOUNTER — Encounter: Payer: Self-pay | Admitting: Obstetrics & Gynecology

## 2024-03-21 ENCOUNTER — Ambulatory Visit: Attending: Family Medicine

## 2024-03-21 ENCOUNTER — Other Ambulatory Visit: Payer: Self-pay

## 2024-03-21 DIAGNOSIS — M84454D Pathological fracture, pelvis, subsequent encounter for fracture with routine healing: Secondary | ICD-10-CM | POA: Insufficient documentation

## 2024-03-21 DIAGNOSIS — R102 Pelvic and perineal pain unspecified side: Secondary | ICD-10-CM | POA: Insufficient documentation

## 2024-03-21 DIAGNOSIS — R293 Abnormal posture: Secondary | ICD-10-CM | POA: Diagnosis not present

## 2024-03-21 DIAGNOSIS — M62838 Other muscle spasm: Secondary | ICD-10-CM | POA: Diagnosis not present

## 2024-03-21 DIAGNOSIS — R279 Unspecified lack of coordination: Secondary | ICD-10-CM | POA: Insufficient documentation

## 2024-03-21 DIAGNOSIS — M6281 Muscle weakness (generalized): Secondary | ICD-10-CM | POA: Insufficient documentation
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
## 2024-03-21 NOTE — Patient Instructions (Signed)

## 2024-03-22 ENCOUNTER — Other Ambulatory Visit: Payer: Self-pay | Admitting: Obstetrics & Gynecology

## 2024-03-22 MED ORDER — ESTRADIOL 0.01 % VA CREA: TOPICAL_CREAM | VAGINAL | 0 refills | Status: DC

## 2024-03-22 NOTE — Progress Notes (Signed)
 Pt requested vaginal estrogen--seeing pelvic PT She needs appt--haven't see for almost 2 years.

## 2024-03-23 ENCOUNTER — Other Ambulatory Visit: Payer: Self-pay

## 2024-03-23 ENCOUNTER — Other Ambulatory Visit (HOSPITAL_BASED_OUTPATIENT_CLINIC_OR_DEPARTMENT_OTHER): Payer: Self-pay

## 2024-03-23 MED ORDER — DIAZEPAM 5 MG PO TABS
5.0000 mg | ORAL_TABLET | Freq: Two times a day (BID) | ORAL | 0 refills | Status: DC | PRN
  Filled 2024-03-23: qty 20, 10d supply, fill #0

## 2024-03-23 MED ORDER — ESTRADIOL 0.01 % VA CREA
TOPICAL_CREAM | VAGINAL | 0 refills | Status: DC
  Filled 2024-03-23: qty 42.5, 30d supply, fill #0

## 2024-03-23 MED ORDER — HYDROCODONE-ACETAMINOPHEN 5-325 MG PO TABS
1.0000 | ORAL_TABLET | ORAL | 0 refills | Status: DC | PRN
  Filled 2024-03-23: qty 30, 3d supply, fill #0

## 2024-04-04 ENCOUNTER — Other Ambulatory Visit: Payer: Self-pay

## 2024-04-04 ENCOUNTER — Other Ambulatory Visit (HOSPITAL_BASED_OUTPATIENT_CLINIC_OR_DEPARTMENT_OTHER): Payer: Self-pay

## 2024-04-04 MED ORDER — DIAZEPAM 5 MG PO TABS
5.0000 mg | ORAL_TABLET | Freq: Two times a day (BID) | ORAL | 0 refills | Status: DC | PRN
  Filled 2024-04-04: qty 5, 3d supply, fill #0

## 2024-04-04 MED ORDER — TOBRAMYCIN-DEXAMETHASONE 0.3-0.1 % OP SUSP
1.0000 [drp] | Freq: Four times a day (QID) | OPHTHALMIC | 0 refills | Status: DC
  Filled 2024-04-04: qty 5, 25d supply, fill #0

## 2024-04-19 ENCOUNTER — Ambulatory Visit

## 2024-04-26 ENCOUNTER — Ambulatory Visit: Attending: Family Medicine | Admitting: Physical Therapy

## 2024-04-26 DIAGNOSIS — R279 Unspecified lack of coordination: Secondary | ICD-10-CM | POA: Diagnosis not present

## 2024-04-26 DIAGNOSIS — M6281 Muscle weakness (generalized): Secondary | ICD-10-CM | POA: Diagnosis not present

## 2024-04-26 DIAGNOSIS — R293 Abnormal posture: Secondary | ICD-10-CM | POA: Insufficient documentation

## 2024-04-26 DIAGNOSIS — M62838 Other muscle spasm: Secondary | ICD-10-CM | POA: Insufficient documentation

## 2024-04-26 DIAGNOSIS — R102 Pelvic and perineal pain unspecified side: Secondary | ICD-10-CM | POA: Insufficient documentation
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
## 2024-05-03 ENCOUNTER — Ambulatory Visit

## 2024-05-03 ENCOUNTER — Other Ambulatory Visit: Payer: Self-pay | Admitting: Family

## 2024-05-03 ENCOUNTER — Ambulatory Visit (INDEPENDENT_AMBULATORY_CARE_PROVIDER_SITE_OTHER)

## 2024-05-03 ENCOUNTER — Other Ambulatory Visit (HOSPITAL_BASED_OUTPATIENT_CLINIC_OR_DEPARTMENT_OTHER): Payer: Self-pay

## 2024-05-03 ENCOUNTER — Encounter: Payer: Self-pay | Admitting: Family Medicine

## 2024-05-03 ENCOUNTER — Ambulatory Visit: Admitting: Family Medicine

## 2024-05-03 ENCOUNTER — Ambulatory Visit: Payer: Self-pay

## 2024-05-03 VITALS — BP 102/62 | HR 76 | Ht 64.0 in | Wt 133.0 lb

## 2024-05-03 DIAGNOSIS — R279 Unspecified lack of coordination: Secondary | ICD-10-CM

## 2024-05-03 DIAGNOSIS — M25512 Pain in left shoulder: Secondary | ICD-10-CM

## 2024-05-03 DIAGNOSIS — M19012 Primary osteoarthritis, left shoulder: Secondary | ICD-10-CM | POA: Diagnosis not present

## 2024-05-03 DIAGNOSIS — R102 Pelvic and perineal pain unspecified side: Secondary | ICD-10-CM

## 2024-05-03 DIAGNOSIS — M6281 Muscle weakness (generalized): Secondary | ICD-10-CM

## 2024-05-03 DIAGNOSIS — M12812 Other specific arthropathies, not elsewhere classified, left shoulder: Secondary | ICD-10-CM

## 2024-05-03 DIAGNOSIS — G8929 Other chronic pain: Secondary | ICD-10-CM | POA: Diagnosis not present

## 2024-05-03 DIAGNOSIS — M170 Bilateral primary osteoarthritis of knee: Secondary | ICD-10-CM

## 2024-05-03 DIAGNOSIS — R293 Abnormal posture: Secondary | ICD-10-CM

## 2024-05-03 DIAGNOSIS — M62838 Other muscle spasm: Secondary | ICD-10-CM

## 2024-05-03 MED ORDER — MELOXICAM 15 MG PO TABS
15.0000 mg | ORAL_TABLET | Freq: Every day | ORAL | 0 refills | Status: AC
  Filled 2024-05-03: qty 30, 30d supply, fill #0
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
## 2024-05-03 NOTE — Assessment & Plan Note (Signed)
 Bilateral injections given.  Discussed icing regimen and home exercises, discussed which activities to do and which ones to avoid.  Increase activity slowly.  Follow-up again in 6 to 12 weeks.

## 2024-05-03 NOTE — Patient Instructions (Addendum)
 Good to see you. Injected knees today. Left shoulder x-ray on the way out.  Left shoulder MRI at [facility]. See me again 2 to 3 months.

## 2024-05-03 NOTE — Assessment & Plan Note (Signed)
 Known some rotator cuff arthropathy but patient is wondering about surgical intervention and if anything is available for this individual.  Will get MRI to further evaluate to see if any surgical intervention such as arthroscopy could be okay or if truly replacement is necessary.  Patient wants to see this.  Discussed with patient that we will follow-up after imaging to discuss either injections or surgical intervention

## 2024-05-04 ENCOUNTER — Telehealth: Payer: Self-pay | Admitting: *Deleted

## 2024-05-04 ENCOUNTER — Ambulatory Visit (INDEPENDENT_AMBULATORY_CARE_PROVIDER_SITE_OTHER): Admitting: *Deleted

## 2024-05-04 VITALS — BP 102/70 | Ht 64.0 in | Wt 135.0 lb

## 2024-05-04 DIAGNOSIS — Z0000 Encounter for general adult medical examination without abnormal findings: Secondary | ICD-10-CM | POA: Diagnosis not present
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
## 2024-05-04 NOTE — Patient Instructions (Signed)
 [name],  Thank you for taking the time for your Medicare Wellness Visit. I appreciate your continued commitment to your health goals. Please review the care plan we discussed, and feel free to reach out if I can assist you further.  Please note that Annual Wellness Visits do not include a physical exam. Some assessments may be limited, especially if the visit was conducted virtually. If needed, we may recommend an in-person follow-up with your provider.  Ongoing Care Seeing your primary care provider every 3 to 6 months helps us  monitor your health and provide consistent, personalized care.   Dr Domenica:  07/30/24 8:20am Medicare AWV:  05/07/25 9am, telephone  Referrals If a referral was made during today's visit and you haven't received any updates within two weeks, please contact the referred provider directly to check on the status.  Recommended Screenings:  You will need to get the following vaccines at your local pharmacy: Pneumonia  Health Maintenance  Topic Date Due   Medicare Annual Wellness Visit  Never done   Pneumococcal Vaccine for age over 45 (1 of 2 - PCV) Never done   COVID-19 Vaccine (6 - 2025-26 season) 02/13/2024   Breast Cancer Screening  02/15/2025   Colon Cancer Screening  02/17/2027   DTaP/Tdap/Td vaccine (4 - Td or Tdap) 10/03/2032   Flu Shot  Completed   Osteoporosis screening with Bone Density Scan  Completed   Hepatitis C Screening  Completed   Zoster (Shingles) Vaccine  Completed   Meningitis B Vaccine  Aged Out       05/04/2024    9:02 AM  Advanced Directives  Does Patient Have a Medical Advance Directive? Yes  Type of Estate Agent of [place];Living will  Does patient want to make changes to medical advance directive? No - Patient declined  Copy of Healthcare Power of Attorney in Chart? No - copy requested   Bring a copy of your health care power of attorney and living will to the office to be added to your chart at  your convenience. You can mail a copy to [facility] [address] [place], [place] [address] or email to [email or web address]   Vision: Annual vision screenings are recommended for early detection of glaucoma, cataracts, and diabetic retinopathy. These exams can also reveal signs of chronic conditions such as diabetes and high blood pressure.  Dental: Annual dental screenings help detect early signs of oral cancer, gum disease, and other conditions linked to overall health, including heart disease and diabetes.  Please see the attached documents for additional preventive care recommendations.

## 2024-05-04 NOTE — Telephone Encounter (Signed)
 FYI:  Pt had AWV today.  Reports she is still undergoing PT for pelvic fracture and they are having her do another Bone Density this December as part of the work up for the pelvic fracture to further determine cause.  She also notes that recent imaging shows possible tears and inflammation of her left shoulder and may have to have shoulder replacement in the future.

## 2024-05-07 ENCOUNTER — Ambulatory Visit: Admitting: Obstetrics & Gynecology

## 2024-05-07 ENCOUNTER — Encounter: Payer: Self-pay | Admitting: Family Medicine

## 2024-05-07 ENCOUNTER — Other Ambulatory Visit: Payer: Self-pay | Admitting: Family Medicine

## 2024-05-07 ENCOUNTER — Encounter

## 2024-05-07 MED ORDER — TEMAZEPAM 15 MG PO CAPS
15.0000 mg | ORAL_CAPSULE | Freq: Every evening | ORAL | 3 refills | Status: AC | PRN
  Filled 2024-05-07: qty 30, 30d supply, fill #0
  Filled 2024-07-04: qty 30, 30d supply, fill #1

## 2024-05-07 NOTE — Telephone Encounter (Signed)
 Requesting: temazepam  15mg   Contract: None UDS: None Last Visit: 01/26/24 Next Visit:07/30/24 Last Refill: 01/05/24 #30 and 0RF   Please Advise

## 2024-05-08 ENCOUNTER — Other Ambulatory Visit (HOSPITAL_BASED_OUTPATIENT_CLINIC_OR_DEPARTMENT_OTHER): Payer: Self-pay

## 2024-05-08 ENCOUNTER — Other Ambulatory Visit: Payer: Self-pay

## 2024-05-08 ENCOUNTER — Ambulatory Visit: Payer: Self-pay | Admitting: Family Medicine

## 2024-05-13 ENCOUNTER — Ambulatory Visit

## 2024-05-13 DIAGNOSIS — M25412 Effusion, left shoulder: Secondary | ICD-10-CM | POA: Diagnosis not present

## 2024-05-13 DIAGNOSIS — M7582 Other shoulder lesions, left shoulder: Secondary | ICD-10-CM | POA: Diagnosis not present

## 2024-05-13 DIAGNOSIS — G8929 Other chronic pain: Secondary | ICD-10-CM | POA: Diagnosis not present

## 2024-05-13 DIAGNOSIS — M25512 Pain in left shoulder: Secondary | ICD-10-CM | POA: Diagnosis not present

## 2024-05-13 DIAGNOSIS — M19012 Primary osteoarthritis, left shoulder: Secondary | ICD-10-CM | POA: Diagnosis not present

## 2024-05-14 ENCOUNTER — Ambulatory Visit: Admitting: Obstetrics & Gynecology

## 2024-05-14 ENCOUNTER — Other Ambulatory Visit (HOSPITAL_BASED_OUTPATIENT_CLINIC_OR_DEPARTMENT_OTHER): Payer: Self-pay

## 2024-05-14 ENCOUNTER — Encounter: Payer: Self-pay | Admitting: Obstetrics & Gynecology

## 2024-05-14 ENCOUNTER — Other Ambulatory Visit (HOSPITAL_COMMUNITY)
Admission: RE | Admit: 2024-05-14 | Discharge: 2024-05-14 | Disposition: A | Source: Ambulatory Visit | Attending: Obstetrics & Gynecology | Admitting: Obstetrics & Gynecology

## 2024-05-14 VITALS — BP 133/78 | HR 67 | Ht 64.0 in | Wt 131.0 lb

## 2024-05-14 DIAGNOSIS — N9489 Other specified conditions associated with female genital organs and menstrual cycle: Secondary | ICD-10-CM | POA: Insufficient documentation

## 2024-05-14 DIAGNOSIS — N898 Other specified noninflammatory disorders of vagina: Secondary | ICD-10-CM | POA: Diagnosis not present

## 2024-05-14 DIAGNOSIS — N94819 Vulvodynia, unspecified: Secondary | ICD-10-CM | POA: Diagnosis not present

## 2024-05-14 DIAGNOSIS — M8440XA Pathological fracture, unspecified site, initial encounter for fracture: Secondary | ICD-10-CM | POA: Diagnosis not present

## 2024-05-14 MED ORDER — ESTRADIOL 10 MCG VA TABS
ORAL_TABLET | VAGINAL | 3 refills | Status: AC
  Filled 2024-05-14: qty 24, 49d supply, fill #0
  Filled 2024-07-18: qty 24, 49d supply, fill #1
  Filled 2024-07-19: qty 8, 28d supply, fill #1

## 2024-05-14 NOTE — Progress Notes (Signed)
   Subjective:    Patient ID: Shannon [name], female    DOB: 01/09/1956, 68 y.o.   MRN: [id number]  HPI  68 year old female who presents with vaginal pain and burning.  Patient has been going to pelvic PT.  They suggested topical estrogen which she has been using.  She has not been using intravaginal estrogen.  She has still having burning.  She is not been tested for bacterial vaginosis or yeast.  She is not sexually active at this time as her husband has a history of prostate cancer.  She is also interested in vulvar moisturizers and handout was given today.  Review of Systems  Constitutional: Negative.   Respiratory: Negative.    Cardiovascular: Negative.   Gastrointestinal: Negative.   Genitourinary:  Positive for vaginal pain. Negative for vaginal bleeding.       Vaginal burning and dryness       Objective:   Physical Exam Vitals reviewed.  Constitutional:      General: She is not in acute distress.    Appearance: She is well-developed.  HENT:     Head: Normocephalic and atraumatic.  Eyes:     Conjunctiva/sclera: Conjunctivae normal.  Cardiovascular:     Rate and Rhythm: Normal rate.  Pulmonary:     Effort: Pulmonary effort is normal.  Genitourinary:  Skin:    General: Skin is warm and dry.  Neurological:     Mental Status: She is alert and oriented to person, place, and time.  Psychiatric:        Mood and Affect: Mood normal.    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  Will see how she does with vaginal estrogen.  Also ruling out bacterial vaginosis and yeast infection today. Start vaginal estrogen nightly for 2 weeks then reduce to twice a week Increase estrogen cream to introitus for 2 weeks and then  back down to twice a week.  She should have enough in the tube of Estrace  to complete this treatment as well as start the Vagifem  Handout on over-the-counter vaginal and vulvar moisturizers given. Hx of recent pelvic Fx--followed by PCP, sports med and has endocrinology appt at [facility] next month to discuss medications.  Pt is maximizing Ca, Vit D and weight bearing exercise  RTC 3 months

## 2024-05-14 NOTE — Patient Instructions (Signed)
 Vaginal and Vulvar Moisturizers.    Moisturizers  They are used in the vagina to hydrate the mucous membrane that make up the vaginal canal.  Designed to keep a more normal acid balance (ph)  Once placed in the vagina, it will last between two to three days.  Use 2-3 times per week at bedtime  Ingredients to avoid is glycerin  and fragrance, can increase chance of infection  Should not be used just before sex due to causing irritation  Most are gels administered either in a tampon-shaped applicator or as a vaginal suppository. They are non-hormonal.  Best to massage the moisturizers into the vaginal canal and vulva area   Types of Moisturizers  Vitamin E vaginal suppositories- Whole foods, Amazon  Moist Again  Coconut oil- can break down condoms  Julva- (Do no use if on Tamoxifen) amazon  Yes moisturizer- amazon  NeuEve Silk , NeuEve Silver for menopausal or over 65 (if have severe vaginal atrophy or cancer treatments use NeuEve Silk for 1 month than move to [employer])- [employer], [place].com  Olive and Bee intimate cream- www.oliveandbee.com.au  Mae vaginal moisturizer- Amazon  Aloe  Bonafide  Hyaloronic Acid suppositories   Creams to use externally on the Vulva area daily  Marathon Oil (good for for cancer patients that had radiation to the area)- guam or [employer].https://garcia-[email or web address]/  V-magic cream - amazon  Julva-amazon  Vital V Wild Yam salve ( help moisturize and help with thinning vulvar area, does have Beeswax  MoodMaid Botanical Pro-Meno Wild Yam Cream- Amazon  Desert Harvest Gele  Cleo by Sherrlyn labial moisturizer (Amazon,  Coconut or olive oil  Aloe  Yes  Good Clean Love  Enchanted rose    Things to avoid in the vaginal area  Do not use things to irritate the vulvar area    No lotions just specialized creams for the vulva area- Neogyn, V-magic, No soaps; can use Aveeno or Calendula cleanser if needed. Must be gentle  No deodorants  No douches  Good to  sleep without underwear to let the vaginal area to air out  No scrubbing: spread the lips to let warm water rinse over labias and pat dry

## 2024-05-15 ENCOUNTER — Ambulatory Visit: Attending: Family Medicine

## 2024-05-15 DIAGNOSIS — M25552 Pain in left hip: Secondary | ICD-10-CM | POA: Diagnosis present

## 2024-05-15 DIAGNOSIS — R600 Localized edema: Secondary | ICD-10-CM | POA: Diagnosis present

## 2024-05-15 DIAGNOSIS — M6281 Muscle weakness (generalized): Secondary | ICD-10-CM | POA: Diagnosis present

## 2024-05-15 DIAGNOSIS — M25551 Pain in right hip: Secondary | ICD-10-CM | POA: Insufficient documentation

## 2024-05-15 DIAGNOSIS — M62838 Other muscle spasm: Secondary | ICD-10-CM | POA: Insufficient documentation

## 2024-05-15 DIAGNOSIS — G8929 Other chronic pain: Secondary | ICD-10-CM | POA: Insufficient documentation

## 2024-05-15 DIAGNOSIS — R102 Pelvic and perineal pain unspecified side: Secondary | ICD-10-CM | POA: Diagnosis present

## 2024-05-15 DIAGNOSIS — R293 Abnormal posture: Secondary | ICD-10-CM | POA: Diagnosis present

## 2024-05-15 DIAGNOSIS — M12812 Other specific arthropathies, not elsewhere classified, left shoulder: Secondary | ICD-10-CM | POA: Insufficient documentation

## 2024-05-15 DIAGNOSIS — R279 Unspecified lack of coordination: Secondary | ICD-10-CM | POA: Insufficient documentation

## 2024-05-15 DIAGNOSIS — M25512 Pain in left shoulder: Secondary | ICD-10-CM | POA: Diagnosis present

## 2024-05-15 LAB — CERVICOVAGINAL ANCILLARY ONLY
Bacterial Vaginitis (gardnerella): NEGATIVE
Candida Glabrata: NEGATIVE
Candida Vaginitis: NEGATIVE
Comment: NEGATIVE
Comment: NEGATIVE
Comment: NEGATIVE
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
## 2024-05-17 ENCOUNTER — Other Ambulatory Visit (HOSPITAL_BASED_OUTPATIENT_CLINIC_OR_DEPARTMENT_OTHER): Payer: Self-pay

## 2024-05-17 ENCOUNTER — Other Ambulatory Visit: Payer: Self-pay

## 2024-05-17 ENCOUNTER — Encounter: Payer: Self-pay | Admitting: Family Medicine

## 2024-05-17 DIAGNOSIS — M12812 Other specific arthropathies, not elsewhere classified, left shoulder: Secondary | ICD-10-CM

## 2024-05-18 ENCOUNTER — Ambulatory Visit: Payer: Self-pay | Admitting: Obstetrics & Gynecology

## 2024-05-21 DIAGNOSIS — M19012 Primary osteoarthritis, left shoulder: Secondary | ICD-10-CM | POA: Diagnosis not present

## 2024-05-21 DIAGNOSIS — M75112 Incomplete rotator cuff tear or rupture of left shoulder, not specified as traumatic: Secondary | ICD-10-CM | POA: Diagnosis not present

## 2024-05-22 ENCOUNTER — Ambulatory Visit

## 2024-05-22 DIAGNOSIS — R279 Unspecified lack of coordination: Secondary | ICD-10-CM

## 2024-05-22 DIAGNOSIS — M6281 Muscle weakness (generalized): Secondary | ICD-10-CM | POA: Diagnosis not present

## 2024-05-22 DIAGNOSIS — R102 Pelvic and perineal pain unspecified side: Secondary | ICD-10-CM

## 2024-05-22 DIAGNOSIS — M62838 Other muscle spasm: Secondary | ICD-10-CM

## 2024-05-22 DIAGNOSIS — R293 Abnormal posture: Secondary | ICD-10-CM

## 2024-05-22 NOTE — Patient Instructions (Signed)
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
## 2024-05-23 ENCOUNTER — Ambulatory Visit

## 2024-05-23 ENCOUNTER — Other Ambulatory Visit: Payer: Self-pay

## 2024-05-23 DIAGNOSIS — R600 Localized edema: Secondary | ICD-10-CM

## 2024-05-23 DIAGNOSIS — M6281 Muscle weakness (generalized): Secondary | ICD-10-CM | POA: Diagnosis not present

## 2024-05-23 DIAGNOSIS — G8929 Other chronic pain: Secondary | ICD-10-CM

## 2024-05-29 ENCOUNTER — Ambulatory Visit

## 2024-05-30 ENCOUNTER — Ambulatory Visit
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
## 2024-06-04 ENCOUNTER — Other Ambulatory Visit: Payer: Self-pay

## 2024-06-04 ENCOUNTER — Ambulatory Visit: Admitting: Family Medicine

## 2024-06-04 ENCOUNTER — Encounter: Payer: Self-pay | Admitting: Family Medicine

## 2024-06-04 VITALS — BP 120/74 | HR 62 | Ht 64.0 in | Wt 131.0 lb

## 2024-06-04 DIAGNOSIS — M25512 Pain in left shoulder: Secondary | ICD-10-CM | POA: Diagnosis not present

## 2024-06-04 DIAGNOSIS — M12812 Other specific arthropathies, not elsewhere classified, left shoulder: Secondary | ICD-10-CM

## 2024-06-04 NOTE — Patient Instructions (Signed)
 Good to see you. Happy Holidays. See me again in 2 to 3 months.

## 2024-06-04 NOTE — Assessment & Plan Note (Signed)
 Holding on surgery until we get better bone health.  Discussed icing regimen and home exercises, discussed with patient to continue the home exercises.  Responds well though to injections.  Did have a significant amount of swelling noted today.  Does have increasing stress right now secondary to husband recently having a heart attack.  Follow-up with me again in 2 to 3 months

## 2024-06-05 ENCOUNTER — Ambulatory Visit

## 2024-06-06 ENCOUNTER — Ambulatory Visit

## 2024-06-11 ENCOUNTER — Ambulatory Visit (HOSPITAL_BASED_OUTPATIENT_CLINIC_OR_DEPARTMENT_OTHER)
Admission: RE | Admit: 2024-06-11 | Discharge: 2024-06-11 | Disposition: A | Discharge status: Home / Self Care | Source: Ambulatory Visit | Attending: Family Medicine | Admitting: Family Medicine

## 2024-06-11 ENCOUNTER — Ambulatory Visit

## 2024-06-11 ENCOUNTER — Other Ambulatory Visit: Payer: Self-pay

## 2024-06-11 DIAGNOSIS — M84454D Pathological fracture, pelvis, subsequent encounter for fracture with routine healing: Secondary | ICD-10-CM | POA: Diagnosis present

## 2024-06-11 DIAGNOSIS — R279 Unspecified lack of coordination: Secondary | ICD-10-CM

## 2024-06-11 DIAGNOSIS — M6281 Muscle weakness (generalized): Secondary | ICD-10-CM

## 2024-06-11 DIAGNOSIS — M62838 Other muscle spasm: Secondary | ICD-10-CM

## 2024-06-11 DIAGNOSIS — R293 Abnormal posture: Secondary | ICD-10-CM

## 2024-06-11 DIAGNOSIS — M25551 Pain in right hip: Secondary | ICD-10-CM

## 2024-06-11 DIAGNOSIS — M8589 Other specified disorders of bone density and structure, multiple sites: Secondary | ICD-10-CM | POA: Diagnosis not present

## 2024-06-11 DIAGNOSIS — M8580 Other specified disorders of bone density and structure, unspecified site: Secondary | ICD-10-CM | POA: Insufficient documentation

## 2024-06-11 DIAGNOSIS — R102 Pelvic and perineal pain unspecified side: Secondary | ICD-10-CM
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
## 2024-06-12 ENCOUNTER — Ambulatory Visit

## 2024-06-12 DIAGNOSIS — R600 Localized edema: Secondary | ICD-10-CM

## 2024-06-12 DIAGNOSIS — G8929 Other chronic pain: Secondary | ICD-10-CM

## 2024-06-12 DIAGNOSIS — M25551 Pain in right hip: Secondary | ICD-10-CM

## 2024-06-12 DIAGNOSIS — M6281 Muscle weakness (generalized): Secondary | ICD-10-CM
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
## 2024-06-19 ENCOUNTER — Ambulatory Visit: Attending: Family Medicine

## 2024-06-19 DIAGNOSIS — M25512 Pain in left shoulder: Secondary | ICD-10-CM | POA: Insufficient documentation

## 2024-06-19 DIAGNOSIS — M6281 Muscle weakness (generalized): Secondary | ICD-10-CM | POA: Insufficient documentation

## 2024-06-19 DIAGNOSIS — G8929 Other chronic pain: Secondary | ICD-10-CM | POA: Diagnosis present

## 2024-06-19 DIAGNOSIS — M25552 Pain in left hip: Secondary | ICD-10-CM | POA: Insufficient documentation

## 2024-06-19 DIAGNOSIS — M25551 Pain in right hip: Secondary | ICD-10-CM | POA: Diagnosis present
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
## 2024-06-25 ENCOUNTER — Ambulatory Visit

## 2024-07-02 ENCOUNTER — Ambulatory Visit

## 2024-07-02 DIAGNOSIS — M6281 Muscle weakness (generalized): Secondary | ICD-10-CM

## 2024-07-02 DIAGNOSIS — G8929 Other chronic pain: Secondary | ICD-10-CM

## 2024-07-02 DIAGNOSIS — M25551 Pain in right hip: Secondary | ICD-10-CM

## 2024-07-02 DIAGNOSIS — M25512 Pain in left shoulder: Secondary | ICD-10-CM | POA: Diagnosis not present
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
## 2024-07-03 ENCOUNTER — Other Ambulatory Visit: Payer: Self-pay | Admitting: Family

## 2024-07-03 ENCOUNTER — Encounter: Payer: Self-pay | Admitting: Family Medicine

## 2024-07-03 DIAGNOSIS — E785 Hyperlipidemia, unspecified: Secondary | ICD-10-CM

## 2024-07-05 ENCOUNTER — Other Ambulatory Visit: Payer: Self-pay

## 2024-07-05 ENCOUNTER — Other Ambulatory Visit (HOSPITAL_BASED_OUTPATIENT_CLINIC_OR_DEPARTMENT_OTHER): Payer: Self-pay

## 2024-07-09 ENCOUNTER — Ambulatory Visit

## 2024-07-16 ENCOUNTER — Ambulatory Visit (HOSPITAL_BASED_OUTPATIENT_CLINIC_OR_DEPARTMENT_OTHER)
Admission: RE | Admit: 2024-07-16 | Discharge: 2024-07-16 | Disposition: A | Payer: Self-pay | Source: Ambulatory Visit | Attending: Family | Admitting: Family

## 2024-07-16 ENCOUNTER — Ambulatory Visit

## 2024-07-16 DIAGNOSIS — E785 Hyperlipidemia, unspecified: Secondary | ICD-10-CM

## 2024-07-17 ENCOUNTER — Ambulatory Visit (HOSPITAL_BASED_OUTPATIENT_CLINIC_OR_DEPARTMENT_OTHER)

## 2024-07-18 ENCOUNTER — Ambulatory Visit: Payer: Self-pay | Admitting: Family

## 2024-07-18 ENCOUNTER — Ambulatory Visit

## 2024-07-18 DIAGNOSIS — E785 Hyperlipidemia, unspecified: Secondary | ICD-10-CM

## 2024-07-18 DIAGNOSIS — G8929 Other chronic pain: Secondary | ICD-10-CM

## 2024-07-18 DIAGNOSIS — M25551 Pain in right hip: Secondary | ICD-10-CM

## 2024-07-18 DIAGNOSIS — M6281 Muscle weakness (generalized): Secondary | ICD-10-CM
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
## 2024-07-19 ENCOUNTER — Encounter (HOSPITAL_BASED_OUTPATIENT_CLINIC_OR_DEPARTMENT_OTHER): Payer: Self-pay

## 2024-07-19 ENCOUNTER — Other Ambulatory Visit: Payer: Self-pay | Admitting: Family Medicine

## 2024-07-19 ENCOUNTER — Other Ambulatory Visit: Payer: Self-pay

## 2024-07-19 ENCOUNTER — Other Ambulatory Visit (HOSPITAL_BASED_OUTPATIENT_CLINIC_OR_DEPARTMENT_OTHER): Payer: Self-pay

## 2024-07-19 ENCOUNTER — Encounter: Payer: Self-pay | Admitting: Obstetrics & Gynecology

## 2024-07-19 DIAGNOSIS — E785 Hyperlipidemia, unspecified: Secondary | ICD-10-CM

## 2024-07-19 DIAGNOSIS — R002 Palpitations: Secondary | ICD-10-CM

## 2024-07-19 DIAGNOSIS — R931 Abnormal findings on diagnostic imaging of heart and coronary circulation: Secondary | ICD-10-CM

## 2024-07-19 MED ORDER — ROSUVASTATIN CALCIUM 5 MG PO TABS
5.0000 mg | ORAL_TABLET | Freq: Every day | ORAL | 1 refills | Status: DC
  Filled 2024-07-19: qty 90, 90d supply, fill #0

## 2024-07-20 ENCOUNTER — Other Ambulatory Visit: Payer: Self-pay

## 2024-07-20 ENCOUNTER — Ambulatory Visit: Admitting: Cardiology

## 2024-07-20 ENCOUNTER — Other Ambulatory Visit (HOSPITAL_COMMUNITY): Payer: Self-pay

## 2024-07-20 ENCOUNTER — Encounter: Payer: Self-pay | Admitting: Cardiology

## 2024-07-20 VITALS — BP 148/80 | HR 83 | Resp 17 | Ht 64.0 in | Wt 130.0 lb

## 2024-07-20 DIAGNOSIS — E7800 Pure hypercholesterolemia, unspecified: Secondary | ICD-10-CM

## 2024-07-20 DIAGNOSIS — I2510 Atherosclerotic heart disease of native coronary artery without angina pectoris: Secondary | ICD-10-CM

## 2024-07-20 DIAGNOSIS — I700 Atherosclerosis of aorta: Secondary | ICD-10-CM

## 2024-07-20 MED ORDER — ROSUVASTATIN CALCIUM 20 MG PO TABS
20.0000 mg | ORAL_TABLET | Freq: Every day | ORAL | 3 refills | Status: AC
  Filled 2024-07-20: qty 90, 90d supply, fill #0

## 2024-07-20 NOTE — Patient Instructions (Addendum)
" °  Medication Instructions:  Start Crestor  20 mg daily Start Aspirin 81 mg daily Continue all other medications *If you need a refill on your cardiac medications before your next appointment, please call your pharmacy*  Lab Work: Have fasting cmet and lipid panel in 3 months   Testing/Procedures: None ordered  Follow-Up: At [facility], you and your health needs are our priority.  As part of our continuing mission to provide you with exceptional heart care, our providers are all part of one team.  This team includes your primary Cardiologist (physician) and Advanced Practice Providers or APPs (Physician Assistants and Nurse Practitioners) who all work together to provide you with the care you need, when you need it.  Your next appointment:  1 year   Call in Nov to schedule Feb appointment     Provider:  Dr.Jordan   We recommend signing up for the patient portal called MyChart.  Sign up information is provided on this After Visit Summary.  MyChart is used to connect with patients for Virtual Visits (Telemedicine).  Patients are able to view lab/test results, encounter notes, upcoming appointments, etc.  Non-urgent messages can be sent to your provider as well.   To learn more about what you can do with MyChart, go to [email or web address].           "
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
## 2024-07-30 ENCOUNTER — Ambulatory Visit: Admitting: Family Medicine

## 2024-08-01 ENCOUNTER — Ambulatory Visit

## 2024-08-20 ENCOUNTER — Ambulatory Visit: Admitting: Obstetrics & Gynecology

## 2025-01-31 ENCOUNTER — Encounter: Admitting: Family Medicine

## 2025-03-28 ENCOUNTER — Encounter: Admitting: Family Medicine

## 2025-05-07 ENCOUNTER — Ambulatory Visit
